# Patient Record
Sex: Female | Born: 1990 | Race: Black or African American | Hispanic: No | Marital: Single | Smoking: Never smoker
Health system: Southern US, Community
[De-identification: ages and names within clinical notes are randomized; demographics above are authoritative.]

## PROBLEM LIST (undated history)

## (undated) ENCOUNTER — Inpatient Hospital Stay (HOSPITAL_COMMUNITY): Payer: Self-pay

## (undated) DIAGNOSIS — Z789 Other specified health status: Secondary | ICD-10-CM

## (undated) HISTORY — PX: NO PAST SURGERIES: SHX2092

---

## 2016-03-17 ENCOUNTER — Emergency Department (HOSPITAL_BASED_OUTPATIENT_CLINIC_OR_DEPARTMENT_OTHER)
Admission: EM | Admit: 2016-03-17 | Discharge: 2016-03-18 | Disposition: A | Discharge status: Home / Self Care | Payer: Self-pay | Attending: Emergency Medicine | Admitting: Emergency Medicine

## 2016-03-17 ENCOUNTER — Encounter (HOSPITAL_BASED_OUTPATIENT_CLINIC_OR_DEPARTMENT_OTHER): Payer: Self-pay | Admitting: *Deleted

## 2016-03-17 DIAGNOSIS — Z3202 Encounter for pregnancy test, result negative: Secondary | ICD-10-CM | POA: Insufficient documentation

## 2016-03-17 DIAGNOSIS — G4489 Other headache syndrome: Secondary | ICD-10-CM | POA: Insufficient documentation

## 2016-03-17 LAB — PREGNANCY, URINE: PREG TEST UR: NEGATIVE

## 2016-03-17 MED ORDER — ACETAMINOPHEN 500 MG PO TABS
1000.0000 mg | ORAL_TABLET | Freq: Once | ORAL | Status: AC
  Administered 2016-03-18: 1000 mg via ORAL
  Filled 2016-03-17: qty 2

## 2016-03-17 MED ORDER — IBUPROFEN 800 MG PO TABS
800.0000 mg | ORAL_TABLET | Freq: Once | ORAL | Status: AC
  Administered 2016-03-18: 800 mg via ORAL
  Filled 2016-03-17: qty 1

## 2016-03-17 NOTE — ED Provider Notes (Signed)
CSN: [id number]     Arrival date & time 03/17/16  2042 History   By signing my name below, I, Forrestine Him, attest that this documentation has been prepared under the direction and in the presence of Dmoni Fortson, MD.  Electronically Signed: Forrestine Him, ED Scribe. 03/17/2016. 11:04 PM.  Chief Complaint  Patient presents with  . Headache   Patient is a 25 y.o. female presenting with headaches. The history is provided by the patient. No language interpreter was used.  Headache Pain location:  Generalized Quality:  Unable to specify Radiates to:  Does not radiate Severity currently:  Unable to specify Severity at highest:  Unable to specify Onset quality:  Gradual Duration:  4 weeks Timing:  Intermittent Progression:  Unchanged Chronicity:  New Context: not intercourse and not straining   Relieved by:  None tried Worsened by:  Nothing Ineffective treatments:  Resting in a darkened room Associated symptoms: no abdominal pain, no congestion, no cough, no dizziness, no fatigue, no fever, no focal weakness, no loss of balance, no myalgias, no nausea, no near-syncope, no neck pain, no neck stiffness, no numbness, no photophobia, no seizures, no swollen glands, no syncope, no URI, no visual change, no vomiting and no weakness   Risk factors: no family hx of SAH     HPI Comments: Mallory Harris is a 25 y.o. female without any pertinent past medical history who presents to the Emergency Department complaining of intermittent, ongoing headache x 3-4 weeks. No alleviating factors. No OTC medications or home remedies attempted prior to arrival. No recent fever, chills, nausea, vomiting, or shortness of breath. Pt denies any history of same.  PCP: No PCP Per Patient    History reviewed. No pertinent past medical history. History reviewed. No pertinent past surgical history. No family history on file. Social History  Substance Use Topics  . Smoking status: Never Smoker   . Smokeless  tobacco: None  . Alcohol Use: No   OB History    No data available     Review of Systems  Constitutional: Negative for fever, chills and fatigue.  HENT: Negative for congestion.   Eyes: Negative for photophobia.  Respiratory: Negative for cough, chest tightness and shortness of breath.   Cardiovascular: Negative for chest pain, palpitations, leg swelling, syncope and near-syncope.  Gastrointestinal: Negative for nausea, vomiting and abdominal pain.  Musculoskeletal: Negative for myalgias, neck pain and neck stiffness.  Neurological: Positive for headaches. Negative for dizziness, tremors, focal weakness, seizures, syncope, facial asymmetry, speech difficulty, weakness, light-headedness, numbness and loss of balance.  Psychiatric/Behavioral: Negative for confusion.  All other systems reviewed and are negative.     Allergies  Review of patient's allergies indicates no known allergies.  Home Medications   Prior to Admission medications   Not on File   Triage Vitals: BP 124/72 mmHg  Pulse 61  Temp(Src) 98.8 F (37.1 C) (Oral)  Resp 18  Ht 5\' 2"  (1.575 m)  Wt 190 lb (86.183 kg)  BMI 34.74 kg/m2  SpO2 100%  LMP 03/15/2016   Physical Exam  Constitutional: She is oriented to person, place, and time. She appears well-developed and well-nourished. No distress.  Sitting comfortably in the room with the lights on.    HENT:  Head: Normocephalic and atraumatic.  Right Ear: External ear normal.  Left Ear: External ear normal.  Mouth/Throat: Oropharynx is clear and moist. No oropharyngeal exudate.  Eyes: Conjunctivae and EOM are normal. Pupils are equal, round, and reactive to light. No scleral  icterus.  Neck: Normal range of motion. Neck supple.  Trachea is midline No lymph nodes noted   Cardiovascular: Normal rate, regular rhythm and normal heart sounds.   Pulmonary/Chest: Effort normal and breath sounds normal. No stridor. She has no wheezes. She has no rales.  Abdominal:  Soft. She exhibits no distension. There is no tenderness. There is no rebound and no guarding.  Hyperactive bowel sounds   Musculoskeletal: Normal range of motion. She exhibits no edema.  Lymphadenopathy:    She has no cervical adenopathy.  Neurological: She is alert and oriented to person, place, and time. She has normal reflexes. She displays normal reflexes. No cranial nerve deficit. Coordination normal.  Cranial nerves 2-12 intact   Skin: Skin is warm and dry. She is not diaphoretic.  Psychiatric: She has a normal mood and affect. Judgment normal.  Nursing note and vitals reviewed.   ED Course  Procedures (including critical care time)  DIAGNOSTIC STUDIES: Oxygen Saturation is 100% on R, Normal by my interpretation.    COORDINATION OF CARE: 11:04 PM- Will order urine pregnancy. Discussed treatment plan with pt at bedside and pt agreed to plan.   Labs Review Labs Reviewed  PREGNANCY, URINE    Imaging Review No results found. I have personally reviewed and evaluated these images and lab results as part of my medical decision-making.   EKG Interpretation None      MDM   Final diagnoses:  None   Filed Vitals:   03/17/16 2049 03/18/16 0055  BP: 124/72 123/78  Pulse: 61 62  Temp: 98.8 F (37.1 C)   Resp: 18 18   Results for orders placed or performed during the hospital encounter of 03/17/16  Pregnancy, urine  Result Value Ref Range   Preg Test, Ur NEGATIVE NEGATIVE   Ct Head Wo Contrast  03/18/2016  CLINICAL DATA:  Right frontal headache for 1 month. EXAM: CT HEAD WITHOUT CONTRAST TECHNIQUE: Contiguous axial images were obtained from the base of the skull through the vertex without intravenous contrast. COMPARISON:  None. FINDINGS: Brain: No evidence of acute infarction, hemorrhage, extra-axial collection, ventriculomegaly, or mass effect. Vascular: No hyperdense vessel or unexpected calcification. Skull: Negative for fracture or focal lesion. Sinuses/Orbits: No  acute findings. Other: None. IMPRESSION: Normal head CT. Electronically Signed   By: Jeb Levering M.D.   On: 03/18/2016 00:41    Medications  acetaminophen (TYLENOL) tablet 1,000 mg (1,000 mg Oral Given 03/18/16 0004)  ibuprofen (ADVIL,MOTRIN) tablet 800 mg (800 mg Oral Given 03/18/16 0004)    Patient has taken no meds at home.  Patient is very well appearing with no signs of ICH or meningitis.  No signs or symptoms of caveronous sinus thrombosis. She is laughing texting and watching TV in the room.  Wanted oral medication and this was provided.  She is stable for discharge with close follow up   I personally performed the services described in this documentation, which was scribed in my presence. The recorded information has been reviewed and is accurate.     Veatrice Kells, MD 03/18/16 [phone]

## 2016-03-17 NOTE — ED Notes (Signed)
Headache for a month. If she leans over she "has to hold the front of her neck to get her self right"  per pt.

## 2016-03-18 ENCOUNTER — Encounter (HOSPITAL_BASED_OUTPATIENT_CLINIC_OR_DEPARTMENT_OTHER): Payer: Self-pay | Admitting: Emergency Medicine

## 2016-03-18 ENCOUNTER — Emergency Department (HOSPITAL_BASED_OUTPATIENT_CLINIC_OR_DEPARTMENT_OTHER): Payer: Self-pay

## 2016-03-18 MED ORDER — IBUPROFEN 800 MG PO TABS: 800.0000 mg | ORAL_TABLET | Freq: Three times a day (TID) | ORAL | Status: DC

## 2016-03-18 NOTE — ED Notes (Signed)
Patient transported to CT 

## 2017-01-04 ENCOUNTER — Encounter (HOSPITAL_BASED_OUTPATIENT_CLINIC_OR_DEPARTMENT_OTHER): Payer: Self-pay

## 2017-01-04 ENCOUNTER — Emergency Department (HOSPITAL_BASED_OUTPATIENT_CLINIC_OR_DEPARTMENT_OTHER)
Admission: EM | Admit: 2017-01-04 | Discharge: 2017-01-05 | Disposition: A | Discharge status: Home / Self Care | Payer: Self-pay | Attending: Emergency Medicine | Admitting: Emergency Medicine

## 2017-01-04 DIAGNOSIS — L03115 Cellulitis of right lower limb: Secondary | ICD-10-CM | POA: Insufficient documentation

## 2017-01-04 DIAGNOSIS — L02415 Cutaneous abscess of right lower limb: Secondary | ICD-10-CM | POA: Insufficient documentation

## 2017-01-04 NOTE — ED Triage Notes (Signed)
Abscess to inner right thigh since Monday, no drainage, no fevers

## 2017-01-05 MED ORDER — SULFAMETHOXAZOLE-TRIMETHOPRIM 800-160 MG PO TABS
1.0000 | ORAL_TABLET | Freq: Once | ORAL | Status: AC
  Administered 2017-01-05: 1 via ORAL
  Filled 2017-01-05: qty 1

## 2017-01-05 MED ORDER — HYDROCODONE-ACETAMINOPHEN 5-325 MG PO TABS: ORAL_TABLET | ORAL | 0 refills | Status: DC

## 2017-01-05 MED ORDER — LIDOCAINE-EPINEPHRINE (PF) 2 %-1:200000 IJ SOLN
20.0000 mL | Freq: Once | INTRAMUSCULAR | Status: AC
  Administered 2017-01-05: 3 mL
  Filled 2017-01-05: qty 20

## 2017-01-05 MED ORDER — SULFAMETHOXAZOLE-TRIMETHOPRIM 800-160 MG PO TABS: 1.0000 | ORAL_TABLET | Freq: Two times a day (BID) | ORAL | 0 refills | Status: AC

## 2017-01-05 NOTE — Discharge Instructions (Signed)
Please read and follow all provided instructions.  Your diagnoses today include:  1. Abscess of right thigh     Tests performed today include:  Vital signs. See below for your results today.   Medications prescribed:   Bactrim (trimethoprim/sulfamethoxazole) - antibiotic  You have been prescribed an antibiotic medicine: take the entire course of medicine even if you are feeling better. Stopping early can cause the antibiotic not to work.   Vicodin (hydrocodone/acetaminophen) - narcotic pain medication  DO NOT drive or perform any activities that require you to be awake and alert because this medicine can make you drowsy. BE VERY CAREFUL not to take multiple medicines containing Tylenol (also called acetaminophen). Doing so can lead to an overdose which can damage your liver and cause liver failure and possibly death.  Take any prescribed medications only as directed.   Home care instructions:   Follow any educational materials contained in this packet  Follow-up instructions: Return to the Emergency Department in 48 hours for a recheck if your symptoms are not significantly improved.  Please follow-up with your primary care provider in the next 1 week for further evaluation of your symptoms.   Return instructions:  Return to the Emergency Department if you have:  Fever  Worsening symptoms  Worsening pain  Worsening swelling  Redness of the skin that moves away from the affected area, especially if it streaks away from the affected area   Any other emergent concerns  Your vital signs today were: BP 121/75 (BP Location: Left Arm)    Pulse 90    Temp 98 F (36.7 C) (Oral)    Resp 18    Ht 5\' 2"  (1.575 m)    Wt 81.6 kg    LMP 12/11/2016    SpO2 100%    BMI 32.92 kg/m  If your blood pressure (BP) was elevated above 135/85 this visit, please have this repeated by your doctor within one month. --------------

## 2017-01-05 NOTE — ED Provider Notes (Signed)
[place] Provider Note   CSN: [id number] Arrival date & time: 01/04/17  2144     History   Chief Complaint Chief Complaint  Patient presents with  . Abscess    HPI Mallory Harris is a 26 y.o. female.  Patient presents with three-day history of redness, pain, and swelling to the right thigh. Patient has a history of abscesses that have spontaneously resolved. No drainage. No fevers, vomiting. No treatments prior to arrival. Onset of symptoms gradual. Course is worsening. Nothing makes symptoms better or worse.      History reviewed. No pertinent past medical history.  There are no active problems to display for this patient.   History reviewed. No pertinent surgical history.  OB History    No data available       Home Medications    Prior to Admission medications   Medication Sig Start Date End Date Taking? Authorizing Provider  HYDROcodone-acetaminophen (NORCO/VICODIN) 5-325 MG tablet Take 1-2 tablets every 6 hours as needed for severe pain 01/05/17   Carlisle Cater, PA-C  sulfamethoxazole-trimethoprim (BACTRIM DS,SEPTRA DS) 800-160 MG tablet Take 1 tablet by mouth 2 (two) times daily. 01/05/17 01/12/17  Carlisle Cater, PA-C    Family History No family history on file.  Social History Social History  Substance Use Topics  . Smoking status: Never Smoker  . Smokeless tobacco: Not on file  . Alcohol use No     Allergies   Patient has no known allergies.   Review of Systems Review of Systems  Constitutional: Negative for fever.  Gastrointestinal: Negative for nausea and vomiting.  Skin: Negative for color change.       Positive for abscess.  Hematological: Negative for adenopathy.     Physical Exam Updated Vital Signs BP 121/75 (BP Location: Left Arm)   Pulse 90   Temp 98 F (36.7 C) (Oral)   Resp 18   Ht 5\' 2"  (1.575 m)   Wt 81.6 kg   LMP 12/11/2016   SpO2 100%   BMI 32.92 kg/m   Physical Exam  Constitutional: She appears  well-developed and well-nourished.  HENT:  Head: Normocephalic and atraumatic.  Eyes: Conjunctivae are normal. Right eye exhibits no discharge. Left eye exhibits no discharge.  Neck: Normal range of motion. Neck supple.  Cardiovascular: Normal rate, regular rhythm and normal heart sounds.   Pulmonary/Chest: Effort normal and breath sounds normal.  Abdominal: Soft. There is no tenderness.  Neurological: She is alert.  Skin: Skin is warm and dry.  Patient with area of abscess and cellulitis on inner right thigh. There is approximately 5 cm induration and an additional 5 cm of surrounding cellulitis.  Psychiatric: She has a normal mood and affect.  Nursing note and vitals reviewed.    ED Treatments / Results   Procedures Procedures (including critical care time)  Medications Ordered in ED Medications  lidocaine-EPINEPHrine (XYLOCAINE W/EPI) 2 %-1:200000 (PF) injection 20 mL (3 mLs Infiltration Given 01/05/17 0054)  sulfamethoxazole-trimethoprim (BACTRIM DS,SEPTRA DS) 800-160 MG per tablet 1 tablet (1 tablet Oral Given 01/05/17 0054)     Initial Impression / Assessment and Plan / ED Course  I have reviewed the triage vital signs and the nursing notes.  Pertinent labs & imaging results that were available during my care of the patient were reviewed by me and considered in my medical decision making (see chart for details).      INCISION AND DRAINAGE Performed by: Cephus Richer PA-S2 Consent: Verbal consent obtained. Risks and  benefits: risks, benefits and alternatives were discussed Type: abscess  Body area: R thigh  Anesthesia: local infiltration  Incision was made with a scalpel.  Local anesthetic: lidocaine 2% with epinephrine  Anesthetic total: 4 ml  Complexity: complex Blunt dissection to break up loculations  Drainage: purulent  Drainage amount: large  Packing material: none  Patient tolerance: Patient tolerated the procedure well with no immediate  complications.   1:02 AM The patient was urged to return to the Emergency Department urgently with worsening pain, swelling, expanding erythema especially if it streaks away from the affected area, fever, or if they have any other concerns.   The patient was urged to return to the Emergency Department or go to their PCP in 48 hours for wound recheck if the area is not significantly improved.  The patient verbalized understanding and stated agreement with this plan.   Patient counseled on use of narcotic pain medications. Counseled not to combine these medications with others containing tylenol. Urged not to drink alcohol, drive, or perform any other activities that requires focus while taking these medications. The patient verbalizes understanding and agrees with the plan.   Final Clinical Impressions(s) / ED Diagnoses   Final diagnoses:  Abscess of right thigh   Patient with skin abscess amenable to incision and drainage. Antibiotic given due to size and associated cellulitis. Patient appears well, nontoxic. No indications for admission or further workup.  New Prescriptions New Prescriptions   HYDROCODONE-ACETAMINOPHEN (NORCO/VICODIN) 5-325 MG TABLET    Take 1-2 tablets every 6 hours as needed for severe pain   SULFAMETHOXAZOLE-TRIMETHOPRIM (BACTRIM DS,SEPTRA DS) 800-160 MG TABLET    Take 1 tablet by mouth 2 (two) times daily.     Carlisle Cater, PA-C 01/05/17 0104    April Palumbo, MD 01/05/17 [phone]

## 2018-01-16 ENCOUNTER — Inpatient Hospital Stay (HOSPITAL_COMMUNITY): Payer: Medicaid Other

## 2018-01-16 ENCOUNTER — Inpatient Hospital Stay (HOSPITAL_COMMUNITY)
Admission: AD | Admit: 2018-01-16 | Discharge: 2018-01-17 | Disposition: A | Discharge status: Home / Self Care | Payer: Medicaid Other | Source: Ambulatory Visit | Attending: Family Medicine | Admitting: Family Medicine

## 2018-01-16 ENCOUNTER — Encounter (HOSPITAL_COMMUNITY): Payer: Self-pay

## 2018-01-16 DIAGNOSIS — Z3A01 Less than 8 weeks gestation of pregnancy: Secondary | ICD-10-CM | POA: Diagnosis not present

## 2018-01-16 DIAGNOSIS — B9689 Other specified bacterial agents as the cause of diseases classified elsewhere: Secondary | ICD-10-CM

## 2018-01-16 DIAGNOSIS — O26899 Other specified pregnancy related conditions, unspecified trimester: Secondary | ICD-10-CM

## 2018-01-16 DIAGNOSIS — N760 Acute vaginitis: Secondary | ICD-10-CM

## 2018-01-16 DIAGNOSIS — R1030 Lower abdominal pain, unspecified: Secondary | ICD-10-CM | POA: Insufficient documentation

## 2018-01-16 DIAGNOSIS — R102 Pelvic and perineal pain: Secondary | ICD-10-CM

## 2018-01-16 DIAGNOSIS — O23591 Infection of other part of genital tract in pregnancy, first trimester: Secondary | ICD-10-CM | POA: Diagnosis not present

## 2018-01-16 DIAGNOSIS — R109 Unspecified abdominal pain: Secondary | ICD-10-CM | POA: Diagnosis present

## 2018-01-16 DIAGNOSIS — O26891 Other specified pregnancy related conditions, first trimester: Secondary | ICD-10-CM | POA: Insufficient documentation

## 2018-01-16 LAB — CBC
HCT: 34.1 % — ABNORMAL LOW (ref 36.0–46.0)
HEMOGLOBIN: 11.2 g/dL — AB (ref 12.0–15.0)
MCH: 27.7 pg (ref 26.0–34.0)
MCHC: 32.8 g/dL (ref 30.0–36.0)
MCV: 84.2 fL (ref 78.0–100.0)
Platelets: 211 10*3/uL (ref 150–400)
RBC: 4.05 MIL/uL (ref 3.87–5.11)
RDW: 12.6 % (ref 11.5–15.5)
WBC: 5.3 10*3/uL (ref 4.0–10.5)

## 2018-01-16 LAB — WET PREP, GENITAL
Sperm: NONE SEEN
Trich, Wet Prep: NONE SEEN
YEAST WET PREP: NONE SEEN

## 2018-01-16 LAB — URINALYSIS, ROUTINE W REFLEX MICROSCOPIC
Bilirubin Urine: NEGATIVE
Glucose, UA: NEGATIVE mg/dL
Hgb urine dipstick: NEGATIVE
Ketones, ur: NEGATIVE mg/dL
LEUKOCYTES UA: NEGATIVE
Nitrite: NEGATIVE
PROTEIN: NEGATIVE mg/dL
Specific Gravity, Urine: 1.011 (ref 1.005–1.030)
pH: 6 (ref 5.0–8.0)

## 2018-01-16 LAB — HCG, QUANTITATIVE, PREGNANCY: HCG, BETA CHAIN, QUANT, S: 3248 m[IU]/mL — AB (ref <5)

## 2018-01-16 LAB — POCT PREGNANCY, URINE: Preg Test, Ur: POSITIVE — AB

## 2018-01-16 NOTE — MAU Provider Note (Signed)
Chief Complaint: Abdominal Pain and Back Pain   First Provider Initiated Contact with Patient 01/16/18 2255        SUBJECTIVE HPI: Mallory Harris is a 27 y.o. No obstetric history on file. at Unknown by LMP who presents to maternity admissions reporting lower abdominal and back pain.  Has been there for about a week.  Not one sided. . She denies vaginal bleeding, vaginal itching/burning, urinary symptoms, h/a, dizziness, n/v, or fever/chills.     Abdominal Pain  This is a new problem. The current episode started in the past 7 days. The onset quality is gradual. The problem has been unchanged. The pain is mild. The quality of the pain is cramping and aching. The abdominal pain radiates to the back. Pertinent negatives include no constipation, diarrhea, dysuria, fever, myalgias, nausea or vomiting. Nothing aggravates the pain. The pain is relieved by nothing. She has tried nothing for the symptoms.  Back Pain  This is a new problem. The current episode started in the past 7 days. The problem is unchanged. The pain is present in the lumbar spine. The quality of the pain is described as aching. Associated symptoms include abdominal pain. Pertinent negatives include no dysuria or fever.    RN Note: Pt here with c/o abdominal and back pains. Not sure if pregnant. Not on birth control currently.    No past medical history on file. No past surgical history on file. Social History   Socioeconomic History  . Marital status: Single    Spouse name: Not on file  . Number of children: Not on file  . Years of education: Not on file  . Highest education level: Not on file  Social Needs  . Financial resource strain: Not on file  . Food insecurity - worry: Not on file  . Food insecurity - inability: Not on file  . Transportation needs - medical: Not on file  . Transportation needs - non-medical: Not on file  Occupational History  . Not on file  Tobacco Use  . Smoking status: Never Smoker   Substance and Sexual Activity  . Alcohol use: No  . Drug use: No  . Sexual activity: Yes    Birth control/protection: None  Other Topics Concern  . Not on file  Social History Narrative  . Not on file   No current facility-administered medications on file prior to encounter.    Current Outpatient Medications on File Prior to Encounter  Medication Sig Dispense Refill  . HYDROcodone-acetaminophen (NORCO/VICODIN) 5-325 MG tablet Take 1-2 tablets every 6 hours as needed for severe pain 6 tablet 0   No Known Allergies  I have reviewed patient's Past Medical Hx, Surgical Hx, Family Hx, Social Hx, medications and allergies.   ROS:  Review of Systems  Constitutional: Negative for fever.  Gastrointestinal: Positive for abdominal pain. Negative for constipation, diarrhea, nausea and vomiting.  Genitourinary: Negative for dysuria.  Musculoskeletal: Positive for back pain. Negative for myalgias.   Review of Systems  Other systems negative   Physical Exam  Physical Exam Patient Vitals for the past 24 hrs:  BP Temp Temp src Resp SpO2 Height Weight  01/16/18 2151 133/61 98.3 F (36.8 C) Oral 18 100 % 5\' 2"  (1.575 m) 187 lb (84.8 kg)   Constitutional: Well-developed, well-nourished female in no acute distress.  Cardiovascular: normal rate Respiratory: normal effort GI: Abd soft, non-tender. Pos BS x 4 MS: Extremities nontender, no edema, normal ROM Neurologic: Alert and oriented x 4.  GU: Neg  CVAT.  PELVIC EXAM: Cervix pink, visually closed, without lesion, scant white creamy discharge, vaginal walls and external genitalia normal Bimanual exam: Cervix 0/long/high, firm, anterior, neg CMT, uterus nontender, nonenlarged, adnexa without tenderness, enlargement, or mass   LAB RESULTS Results for orders placed or performed during the hospital encounter of 01/16/18 (from the past 24 hour(s))  Urinalysis, Routine w reflex microscopic     Status: None   Collection Time: 01/16/18  9:53  PM  Result Value Ref Range   Color, Urine YELLOW YELLOW   APPearance CLEAR CLEAR   Specific Gravity, Urine 1.011 1.005 - 1.030   pH 6.0 5.0 - 8.0   Glucose, UA NEGATIVE NEGATIVE mg/dL   Hgb urine dipstick NEGATIVE NEGATIVE   Bilirubin Urine NEGATIVE NEGATIVE   Ketones, ur NEGATIVE NEGATIVE mg/dL   Protein, ur NEGATIVE NEGATIVE mg/dL   Nitrite NEGATIVE NEGATIVE   Leukocytes, UA NEGATIVE NEGATIVE  Pregnancy, urine POC     Status: Abnormal   Collection Time: 01/16/18  9:59 PM  Result Value Ref Range   Preg Test, Ur POSITIVE (A) NEGATIVE  CBC     Status: Abnormal   Collection Time: 01/16/18 10:29 PM  Result Value Ref Range   WBC 5.3 4.0 - 10.5 K/uL   RBC 4.05 3.87 - 5.11 MIL/uL   Hemoglobin 11.2 (L) 12.0 - 15.0 g/dL   HCT 34.1 (L) 36.0 - 46.0 %   MCV 84.2 78.0 - 100.0 fL   MCH 27.7 26.0 - 34.0 pg   MCHC 32.8 30.0 - 36.0 g/dL   RDW 12.6 11.5 - 15.5 %   Platelets 211 150 - 400 K/uL  hCG, quantitative, pregnancy     Status: Abnormal   Collection Time: 01/16/18 10:29 PM  Result Value Ref Range   hCG, Beta Chain, Quant, S 3,248 (H) <5 mIU/mL  Wet prep, genital     Status: Abnormal   Collection Time: 01/16/18 11:00 PM  Result Value Ref Range   Yeast Wet Prep HPF POC NONE SEEN NONE SEEN   Trich, Wet Prep NONE SEEN NONE SEEN   Clue Cells Wet Prep HPF POC PRESENT (A) NONE SEEN   WBC, Wet Prep HPF POC FEW (A) NONE SEEN   Sperm NONE SEEN        IMAGING US Ob Comp Less 14 Wks  Result Date: 01/17/2018 CLINICAL DATA:  Lower abdominal pain and cramping for 1 week. Beta HCG 3248. Gestational age by last menstrual period 4 weeks and 5 days. EXAM: OBSTETRIC <14 WK [place] AND TRANSVAGINAL OB US TECHNIQUE: Both transabdominal and transvaginal ultrasound examinations were performed for complete evaluation of the gestation as well as the maternal uterus, adnexal regions, and pelvic cul-de-sac. Transvaginal technique was performed to assess early pregnancy. COMPARISON:  None. FINDINGS: Intrauterine  gestational sac: Present Yolk sac:  Present Embryo:  Not present Cardiac Activity: Not present MSD: 5 mm   5 w   1 d Subchorionic hemorrhage:  None visualized. Maternal uterus/adnexae: Normal appearance of the adnexa. RIGHT intra ovarian 15 mm corpus luteal cyst. No free fluid. IMPRESSION: Early intrauterine gestational and yolk sac, without fetal pole, or cardiac activity yet visualized. Recommend follow-up quantitative B-HCG levels and follow-up US in 14 days to confirm and assess viability. This recommendation follows SRU consensus guidelines: Diagnostic Criteria for Nonviable Pregnancy Early in the First Trimester. [employer] Med 2013; 161:0960-45. Electronically Signed   By: Elon Alas M.D.   On: 01/17/2018 00:04   US Ob Transvaginal  Result Date:  01/17/2018 CLINICAL DATA:  Lower abdominal pain and cramping for 1 week. Beta HCG 3248. Gestational age by last menstrual period 4 weeks and 5 days. EXAM: OBSTETRIC <14 WK [place] AND TRANSVAGINAL OB US TECHNIQUE: Both transabdominal and transvaginal ultrasound examinations were performed for complete evaluation of the gestation as well as the maternal uterus, adnexal regions, and pelvic cul-de-sac. Transvaginal technique was performed to assess early pregnancy. COMPARISON:  None. FINDINGS: Intrauterine gestational sac: Present Yolk sac:  Present Embryo:  Not present Cardiac Activity: Not present MSD: 5 mm   5 w   1 d Subchorionic hemorrhage:  None visualized. Maternal uterus/adnexae: Normal appearance of the adnexa. RIGHT intra ovarian 15 mm corpus luteal cyst. No free fluid. IMPRESSION: Early intrauterine gestational and yolk sac, without fetal pole, or cardiac activity yet visualized. Recommend follow-up quantitative B-HCG levels and follow-up US in 14 days to confirm and assess viability. This recommendation follows SRU consensus guidelines: Diagnostic Criteria for Nonviable Pregnancy Early in the First Trimester. [employer] Med 2013; 115:7262-03. Electronically  Signed   By: Elon Alas M.D.   On: 01/17/2018 00:04    MAU Management/MDM: Ordered usual first trimester r/o ectopic labs.   Pelvic exam and cultures done Will check baseline Ultrasound to rule out ectopic.  This bleeding/pain can represent a normal pregnancy with bleeding, spontaneous abortion or even an ectopic which can be life-threatening.  The process as listed above helps to determine which of these is present.  Reviewed results.  Yolk sac effectively rules out ectopic pregnancy.   ASSESSMENT Single IUP at [redacted]w[redacted]d Lower abdominal pain Bacterial Vaginosis  PLAN Discharge home Rx Flagyl for BV Proof of pregnancy letter given Encouraged to start prenatal care  Pt stable at time of discharge. Encouraged to return here or to other Urgent Care/ED if she develops worsening of symptoms, increase in pain, fever, or other concerning symptoms.    Hansel Feinstein CNM, MSN Certified Nurse-Midwife 01/16/2018  10:56 PM

## 2018-01-16 NOTE — MAU Note (Signed)
Pt here with c/o abdominal and back pains. Not sure if pregnant. Not on birth control currently.

## 2018-01-17 ENCOUNTER — Encounter (HOSPITAL_COMMUNITY): Payer: Self-pay

## 2018-01-17 DIAGNOSIS — B9689 Other specified bacterial agents as the cause of diseases classified elsewhere: Secondary | ICD-10-CM

## 2018-01-17 DIAGNOSIS — N760 Acute vaginitis: Secondary | ICD-10-CM

## 2018-01-17 LAB — HIV ANTIBODY (ROUTINE TESTING W REFLEX): HIV Screen 4th Generation wRfx: NONREACTIVE

## 2018-01-17 LAB — GC/CHLAMYDIA PROBE AMP (~~LOC~~) NOT AT ARMC
CHLAMYDIA, DNA PROBE: NEGATIVE
Neisseria Gonorrhea: NEGATIVE

## 2018-01-17 MED ORDER — PRENATAL VITAMINS 0.8 MG PO TABS: 1.0000 | ORAL_TABLET | Freq: Every day | ORAL | 12 refills | Status: DC

## 2018-01-17 MED ORDER — METRONIDAZOLE 500 MG PO TABS: 500.0000 mg | ORAL_TABLET | Freq: Two times a day (BID) | ORAL | 0 refills | Status: AC

## 2018-01-17 NOTE — Discharge Instructions (Signed)
Prenatal Care Providers  OB/GYN  & Infertility  [phone]     Phone: [place]                      [facility]  @[place]     Phone: [phone]  Phone: [place]     Phone: [phone]  Phone: [phone]           [facility] [place]                hone: [phone]  Phone: [phone]         [facility]                                Phone: [place] [facility].                Phone: [phone]  [place])          Phone: [phone] [facility]   Phone: [phone]  Bacterial Vaginosis Bacterial vaginosis is a vaginal infection that occurs when the normal balance of bacteria in the vagina is disrupted. It results from an overgrowth of certain bacteria. This is the most common vaginal infection among women ages 71-44. Because bacterial vaginosis increases your risk for STIs (sexually transmitted infections), getting treated can help reduce your risk for chlamydia, gonorrhea, herpes, and HIV (human immunodeficiency virus). Treatment is also important for preventing complications in pregnant women, because this condition can cause an early (premature) delivery. What are the causes? This condition is caused by an increase in harmful bacteria that are normally present in small amounts in the vagina. However, the reason that the condition develops is not fully understood. What increases the risk? The following factors may make you more likely to develop this condition:  Having a new sexual partner or multiple sexual partners.  Having unprotected sex.  Douching.  Having an intrauterine device (IUD).  Smoking.  Drug and alcohol abuse.  Taking certain  antibiotic medicines.  Being pregnant.  You cannot get bacterial vaginosis from toilet seats, bedding, swimming pools, or contact with objects around you. What are the signs or symptoms? Symptoms of this condition include:  Grey or white vaginal discharge. The discharge can also be watery or foamy.  A fish-like odor with discharge, especially after sexual intercourse or during menstruation.  Itching in and around the vagina.  Burning or pain with urination.  Some women with bacterial vaginosis have no signs or symptoms. How is this diagnosed? This condition is diagnosed based on:  Your medical history.  A physical exam of the vagina.  Testing a sample of vaginal fluid under a microscope to look for a large amount of bad bacteria or abnormal cells. Your health care provider may use a cotton swab or a small wooden spatula to collect the sample.  How is this treated? This condition is treated with antibiotics. These may be given as a pill, a vaginal cream, or a medicine that is put into the vagina (suppository). If the condition comes  back after treatment, a second round of antibiotics may be needed. Follow these instructions at home: Medicines  Take over-the-counter and prescription medicines only as told by your health care provider.  Take or use your antibiotic as told by your health care provider. Do not stop taking or using the antibiotic even if you start to feel better. General instructions  If you have a female sexual partner, tell her that you have a vaginal infection. She should see her health care provider and be treated if she has symptoms. If you have a female sexual partner, he does not need treatment.  During treatment: ? Avoid sexual activity until you finish treatment. ? Do not douche. ? Avoid alcohol as directed by your health care provider. ? Avoid breastfeeding as directed by your health care provider.  Drink enough water and fluids to keep your urine clear  or pale yellow.  Keep the area around your vagina and rectum clean. ? Wash the area daily with warm water. ? Wipe yourself from front to back after using the toilet.  Keep all follow-up visits as told by your health care provider. This is important. How is this prevented?  Do not douche.  Wash the outside of your vagina with warm water only.  Use protection when having sex. This includes latex condoms and dental dams.  Limit how many sexual partners you have. To help prevent bacterial vaginosis, it is best to have sex with just one partner (monogamous).  Make sure you and your sexual partner are tested for STIs.  Wear cotton or cotton-lined underwear.  Avoid wearing tight pants and pantyhose, especially during summer.  Limit the amount of alcohol that you drink.  Do not use any products that contain nicotine or tobacco, such as cigarettes and e-cigarettes. If you need help quitting, ask your health care provider.  Do not use illegal drugs. Where to find more information:  Centers for Disease Control and Prevention: AppraiserFraud.fi  American Sexual Health Association (ASHA): www.ashastd.org  U.S. Department of Health and Financial controller, Office on Women's Health: DustingSprays.pl or SecuritiesCard.it Contact a health care provider if:  Your symptoms do not improve, even after treatment.  You have more discharge or pain when urinating.  You have a fever.  You have pain in your abdomen.  You have pain during sex.  You have vaginal bleeding between periods. Summary  Bacterial vaginosis is a vaginal infection that occurs when the normal balance of bacteria in the vagina is disrupted.  Because bacterial vaginosis increases your risk for STIs (sexually transmitted infections), getting treated can help reduce your risk for chlamydia, gonorrhea, herpes, and HIV (human immunodeficiency virus). Treatment is also important for  preventing complications in pregnant women, because the condition can cause an early (premature) delivery.  This condition is treated with antibiotic medicines. These may be given as a pill, a vaginal cream, or a medicine that is put into the vagina (suppository). This information is not intended to replace advice given to you by your health care provider. Make sure you discuss any questions you have with your health care provider. Document Released: 11/14/2005 Document Revised: 03/20/2017 Document Reviewed: 07/30/2016 Elsevier Interactive Patient Education  2018 [place] of Pregnancy The first trimester of pregnancy is from week 1 until the end of week 13 (months 1 through 3). During this time, your baby will begin to develop inside you. At 6-8 weeks, the eyes and face are formed, and the heartbeat can be seen on ultrasound.  At the end of 12 weeks, all the baby's organs are formed. Prenatal care is all the medical care you receive before the birth of your baby. Make sure you get good prenatal care and follow all of your doctor's instructions. Follow these instructions at home: Medicines  Take over-the-counter and prescription medicines only as told by your doctor. Some medicines are safe and some medicines are not safe during pregnancy.  Take a prenatal vitamin that contains at least 600 micrograms (mcg) of folic acid.  If you have trouble pooping (constipation), take medicine that will make your stool soft (stool softener) if your doctor approves. Eating and drinking  Eat regular, healthy meals.  Your doctor will tell you the amount of weight gain that is right for you.  Avoid raw meat and uncooked cheese.  If you feel sick to your stomach (nauseous) or throw up (vomit): ? Eat 4 or 5 small meals a day instead of 3 large meals. ? Try eating a few soda crackers. ? Drink liquids between meals instead of during meals.  To prevent constipation: ? Eat foods that are  high in fiber, like fresh fruits and vegetables, whole grains, and beans. ? Drink enough fluids to keep your pee (urine) clear or pale yellow. Activity  Exercise only as told by your doctor. Stop exercising if you have cramps or pain in your lower belly (abdomen) or low back.  Do not exercise if it is too hot, too humid, or if you are in a place of great height (high altitude).  Try to avoid standing for long periods of time. Move your legs often if you must stand in one place for a long time.  Avoid heavy lifting.  Wear low-heeled shoes. Sit and stand up straight.  You can have sex unless your doctor tells you not to. Relieving pain and discomfort  Wear a good support bra if your breasts are sore.  Take warm water baths (sitz baths) to soothe pain or discomfort caused by hemorrhoids. Use hemorrhoid cream if your doctor says it is okay.  Rest with your legs raised if you have leg cramps or low back pain.  If you have puffy, bulging veins (varicose veins) in your legs: ? Wear support hose or compression stockings as told by your doctor. ? Raise (elevate) your feet for 15 minutes, 3-4 times a day. ? Limit salt in your food. Prenatal care  Schedule your prenatal visits by the twelfth week of pregnancy.  Write down your questions. Take them to your prenatal visits.  Keep all your prenatal visits as told by your doctor. This is important. Safety  Wear your seat belt at all times when driving.  Make a list of emergency phone numbers. The list should include numbers for family, friends, the hospital, and police and fire departments. General instructions  Ask your doctor for a referral to a local prenatal class. Begin classes no later than at the start of month 6 of your pregnancy.  Ask for help if you need counseling or if you need help with nutrition. Your doctor can give you advice or tell you where to go for help.  Do not use hot tubs, steam rooms, or saunas.  Do not douche  or use tampons or scented sanitary pads.  Do not cross your legs for long periods of time.  Avoid all herbs and alcohol. Avoid drugs that are not approved by your doctor.  Do not use any tobacco products, including cigarettes, chewing tobacco, and  electronic cigarettes. If you need help quitting, ask your doctor. You may get counseling or other support to help you quit.  Avoid cat litter boxes and soil used by cats. These carry germs that can cause birth defects in the baby and can cause a loss of your baby (miscarriage) or stillbirth.  Visit your dentist. At home, brush your teeth with a soft toothbrush. Be gentle when you floss. Contact a doctor if:  You are dizzy.  You have mild cramps or pressure in your lower belly.  You have a nagging pain in your belly area.  You continue to feel sick to your stomach, you throw up, or you have watery poop (diarrhea).  You have a bad smelling fluid coming from your vagina.  You have pain when you pee (urinate).  You have increased puffiness (swelling) in your face, hands, legs, or ankles. Get help right away if:  You have a fever.  You are leaking fluid from your vagina.  You have spotting or bleeding from your vagina.  You have very bad belly cramping or pain.  You gain or lose weight rapidly.  You throw up blood. It may look like coffee grounds.  You are around people who have [place] measles, fifth disease, or chickenpox.  You have a very bad headache.  You have shortness of breath.  You have any kind of trauma, such as from a fall or a car accident. Summary  The first trimester of pregnancy is from week 1 until the end of week 13 (months 1 through 3).  To take care of yourself and your unborn baby, you will need to eat healthy meals, take medicines only if your doctor tells you to do so, and do activities that are safe for you and your baby.  Keep all follow-up visits as told by your doctor. This is important as your  doctor will have to ensure that your baby is healthy and growing well. This information is not intended to replace advice given to you by your health care provider. Make sure you discuss any questions you have with your health care provider. Document Released: 05/02/2008 Document Revised: 11/22/2016 Document Reviewed: 11/22/2016 Elsevier Interactive Patient Education  2017 [employer].

## 2018-02-26 ENCOUNTER — Inpatient Hospital Stay (HOSPITAL_COMMUNITY)
Admission: AD | Admit: 2018-02-26 | Discharge: 2018-02-26 | Disposition: A | Discharge status: Home / Self Care | Payer: Medicaid Other | Source: Ambulatory Visit | Attending: Obstetrics & Gynecology | Admitting: Obstetrics & Gynecology

## 2018-02-26 ENCOUNTER — Other Ambulatory Visit: Payer: Self-pay

## 2018-02-26 DIAGNOSIS — O26891 Other specified pregnancy related conditions, first trimester: Secondary | ICD-10-CM | POA: Insufficient documentation

## 2018-02-26 DIAGNOSIS — Z711 Person with feared health complaint in whom no diagnosis is made: Secondary | ICD-10-CM

## 2018-02-26 DIAGNOSIS — O0931 Supervision of pregnancy with insufficient antenatal care, first trimester: Secondary | ICD-10-CM | POA: Insufficient documentation

## 2018-02-26 DIAGNOSIS — Z3491 Encounter for supervision of normal pregnancy, unspecified, first trimester: Secondary | ICD-10-CM

## 2018-02-26 DIAGNOSIS — Z3A11 11 weeks gestation of pregnancy: Secondary | ICD-10-CM | POA: Insufficient documentation

## 2018-02-26 NOTE — [facility] Provider Note (Signed)
S: Mallory Harris is a 27 y.o. G1P0 at [redacted]w[redacted]d who presents for a "check up". She has not been able to start prenatal care yet b/c her insurance is pending. Denies abdominal pain or vaginal bleeding.   O: BP 126/68 (BP Location: Right Arm)   Pulse 75   Temp 99.3 F (37.4 C) (Oral)   Resp 17   Wt 193 lb 8 oz (87.8 kg)   LMP 12/14/2017 (Exact Date)   SpO2 100%   BMI 35.39 kg/m   Constitutional: Well-developed, well-nourished female in no acute distress Respiratory: normal effort GI: Abdomen soft, non tender Neuro: Alert & oriented x 4  FHT 164 by doppler  A:  1. Fetal heart tones present, first trimester   2. [redacted] weeks gestation of pregnancy   3. Physically well but worried    P: Discharge home List of OB/gyns given -- start prenatal care Continue taking PNV Discussed reasons to return to [facility]U  Jorje Guild, NP

## 2018-02-26 NOTE — MAU Note (Signed)
Just here for a check ups.  Was here at 5 wks, is waiting on medicaid. Started spotting last Tues or Wed, stopped.  Has had some abd pain, none now.

## 2018-02-26 NOTE — Discharge Instructions (Signed)
[place] for [place] at [facility]       Phone: [phone]  [facility] [place] at [place] Phone: [place] for [place] at [place]  Phone: [place] for [place] at [facility]  Phone: [place] [facility] [place] at [place]  Phone: [facility]       Phone: [phone]  [facility]    Phone: [phone]   [facility])    Phone: [place] [facility]    Phone: [phone]  [facility]    Phone: [place] [facility]  Phone: [place]    Phone: [phone]  [facility] [place]   Phone: [phone]  [place] [facility]    Phone: [phone]   Safe Medications in Pregnancy   Acne: Benzoyl Peroxide Salicylic Acid  Backache/Headache: Tylenol: 2 regular strength every 4 hours OR              2 Extra strength every 6 hours  Colds/Coughs/Allergies: Benadryl (alcohol free) 25 mg every 6 hours as needed Breath right strips Claritin Cepacol throat lozenges Chloraseptic throat spray Cold-Eeze- up to three times per day Cough drops, alcohol free Flonase (by prescription only) Guaifenesin Mucinex Robitussin DM (plain only, alcohol free) Saline nasal spray/drops Sudafed (pseudoephedrine) & Actifed ** use only after [redacted] weeks gestation and if you do not have high blood pressure Tylenol Vicks Vaporub Zinc lozenges Zyrtec   Constipation: Colace Ducolax suppositories Fleet enema Glycerin suppositories Metamucil Milk of magnesia Miralax Senokot Smooth move tea  Diarrhea: Kaopectate Imodium A-D  *NO pepto Bismol  Hemorrhoids: Anusol Anusol HC Preparation  H Tucks  Indigestion: Tums Maalox Mylanta Zantac  Pepcid  Insomnia: Benadryl (alcohol free) 25mg  every 6 hours as needed Tylenol PM Unisom, no Gelcaps  Leg Cramps: Tums MagGel  Nausea/Vomiting:  Bonine Dramamine Emetrol Ginger extract Sea bands Meclizine  Nausea medication to take during pregnancy:  Unisom (doxylamine succinate 25 mg tablets) Take one tablet daily at bedtime. If symptoms are not adequately controlled, the dose can be increased to a maximum recommended dose of two tablets daily (1/2 tablet in the morning, 1/2 tablet mid-afternoon and one at bedtime). Vitamin B6 100mg  tablets. Take one tablet twice a day (up to 200 mg per day).  Skin Rashes: Aveeno products Benadryl cream or 25mg  every 6 hours as needed Calamine Lotion 1% cortisone cream  Yeast infection: Gyne-lotrimin 7 Monistat 7  Gum/tooth pain: Anbesol  **If taking multiple medications, please check labels to avoid duplicating the same active ingredients **take medication as directed on the label ** Do not exceed 4000 mg of tylenol in 24 hours **Do not take medications that contain aspirin or ibuprofen     First Trimester of Pregnancy The first trimester of pregnancy is from week 1 until the end of week 13 (months 1 through 3). During this time, your baby will begin to develop inside you. At 6-8 weeks, the eyes and face are formed, and the heartbeat can be seen on ultrasound. At the end of 12 weeks, all the baby's organs are formed. Prenatal care is all the medical care you receive before the birth of your baby. Make sure you get good prenatal care and follow all of your doctor's instructions. Follow these instructions at home: Medicines  Take over-the-counter and prescription medicines only as told  by your doctor. Some medicines are safe and some medicines are not safe during pregnancy.  Take a prenatal vitamin that contains at least 600 micrograms (mcg) of folic acid.  If you have  trouble pooping (constipation), take medicine that will make your stool soft (stool softener) if your doctor approves. Eating and drinking  Eat regular, healthy meals.  Your doctor will tell you the amount of weight gain that is right for you.  Avoid raw meat and uncooked cheese.  If you feel sick to your stomach (nauseous) or throw up (vomit): ? Eat 4 or 5 small meals a day instead of 3 large meals. ? Try eating a few soda crackers. ? Drink liquids between meals instead of during meals.  To prevent constipation: ? Eat foods that are high in fiber, like fresh fruits and vegetables, whole grains, and beans. ? Drink enough fluids to keep your pee (urine) clear or pale yellow. Activity  Exercise only as told by your doctor. Stop exercising if you have cramps or pain in your lower belly (abdomen) or low back.  Do not exercise if it is too hot, too humid, or if you are in a place of great height (high altitude).  Try to avoid standing for long periods of time. Move your legs often if you must stand in one place for a long time.  Avoid heavy lifting.  Wear low-heeled shoes. Sit and stand up straight.  You can have sex unless your doctor tells you not to. Relieving pain and discomfort  Wear a good support bra if your breasts are sore.  Take warm water baths (sitz baths) to soothe pain or discomfort caused by hemorrhoids. Use hemorrhoid cream if your doctor says it is okay.  Rest with your legs raised if you have leg cramps or low back pain.  If you have puffy, bulging veins (varicose veins) in your legs: ? Wear support hose or compression stockings as told by your doctor. ? Raise (elevate) your feet for 15 minutes, 3-4 times a day. ? Limit salt in your food. Prenatal care  Schedule your prenatal visits by the twelfth week of pregnancy.  Write down your questions. Take them to your prenatal visits.  Keep all your prenatal visits as told by your doctor. This is  important. Safety  Wear your seat belt at all times when driving.  Make a list of emergency phone numbers. The list should include numbers for family, friends, the hospital, and police and fire departments. General instructions  Ask your doctor for a referral to a local prenatal class. Begin classes no later than at the start of month 6 of your pregnancy.  Ask for help if you need counseling or if you need help with nutrition. Your doctor can give you advice or tell you where to go for help.  Do not use hot tubs, steam rooms, or saunas.  Do not douche or use tampons or scented sanitary pads.  Do not cross your legs for long periods of time.  Avoid all herbs and alcohol. Avoid drugs that are not approved by your doctor.  Do not use any tobacco products, including cigarettes, chewing tobacco, and electronic cigarettes. If you need help quitting, ask your doctor. You may get counseling or other support to help you quit.  Avoid cat litter boxes and soil used by cats. These carry germs that can cause birth defects in the baby and can cause a loss of your baby (miscarriage) or stillbirth.  Visit your  dentist. At home, brush your teeth with a soft toothbrush. Be gentle when you floss. Contact a doctor if:  You are dizzy.  You have mild cramps or pressure in your lower belly.  You have a nagging pain in your belly area.  You continue to feel sick to your stomach, you throw up, or you have watery poop (diarrhea).  You have a bad smelling fluid coming from your vagina.  You have pain when you pee (urinate).  You have increased puffiness (swelling) in your face, hands, legs, or ankles. Get help right away if:  You have a fever.  You are leaking fluid from your vagina.  You have spotting or bleeding from your vagina.  You have very bad belly cramping or pain.  You gain or lose weight rapidly.  You throw up blood. It may look like coffee grounds.  You are around people who  have [place] measles, fifth disease, or chickenpox.  You have a very bad headache.  You have shortness of breath.  You have any kind of trauma, such as from a fall or a car accident. Summary  The first trimester of pregnancy is from week 1 until the end of week 13 (months 1 through 3).  To take care of yourself and your unborn baby, you will need to eat healthy meals, take medicines only if your doctor tells you to do so, and do activities that are safe for you and your baby.  Keep all follow-up visits as told by your doctor. This is important as your doctor will have to ensure that your baby is healthy and growing well. This information is not intended to replace advice given to you by your health care provider. Make sure you discuss any questions you have with your health care provider. Document Released: 05/02/2008 Document Revised: 11/22/2016 Document Reviewed: 11/22/2016 Elsevier Interactive Patient Education  2017 [employer].

## 2018-04-17 ENCOUNTER — Ambulatory Visit (INDEPENDENT_AMBULATORY_CARE_PROVIDER_SITE_OTHER): Payer: Medicaid Other | Admitting: Advanced Practice Midwife

## 2018-04-17 ENCOUNTER — Encounter: Payer: Self-pay | Admitting: Advanced Practice Midwife

## 2018-04-17 ENCOUNTER — Other Ambulatory Visit: Payer: Self-pay | Admitting: Advanced Practice Midwife

## 2018-04-17 VITALS — BP 113/57 | HR 75 | Wt 198.4 lb

## 2018-04-17 DIAGNOSIS — Z124 Encounter for screening for malignant neoplasm of cervix: Secondary | ICD-10-CM

## 2018-04-17 DIAGNOSIS — Z6832 Body mass index (BMI) 32.0-32.9, adult: Secondary | ICD-10-CM

## 2018-04-17 DIAGNOSIS — Z3400 Encounter for supervision of normal first pregnancy, unspecified trimester: Secondary | ICD-10-CM | POA: Insufficient documentation

## 2018-04-17 DIAGNOSIS — Z3401 Encounter for supervision of normal first pregnancy, first trimester: Secondary | ICD-10-CM

## 2018-04-17 DIAGNOSIS — Z113 Encounter for screening for infections with a predominantly sexual mode of transmission: Secondary | ICD-10-CM

## 2018-04-17 DIAGNOSIS — Z363 Encounter for antenatal screening for malformations: Secondary | ICD-10-CM

## 2018-04-17 NOTE — Progress Notes (Signed)
  Subjective:    Mallory Harris is being seen today for her first obstetrical visit. She is late to care due to difficulties getting Medicaid. This is a planned pregnancy. She is at [redacted]w[redacted]d gestation by certain LMP. Had [place] ~5 weeks showing YS, but no fetal pole. Cannot use it for dating. Her obstetrical history is significant for obesity. Relationship with FOB: significant other, living together. Patient does intend to breast feed. Pregnancy history fully reviewed.  Patient reports no complaints.  Review of Systems:   Review of Systems  Constitutional: Negative for chills and fever.  Gastrointestinal: Negative for abdominal pain, nausea and vomiting.  Genitourinary: Negative for dysuria, vaginal bleeding and vaginal discharge.    Objective:     BP (!) 113/57   Pulse 75   Wt 198 lb 6.4 oz (90 kg)   LMP 12/14/2017 (Exact Date)   BMI 36.29 kg/m  Physical Exam  Nursing note and vitals reviewed. Constitutional: She is oriented to person, place, and time. She appears well-developed and well-nourished. No distress.  HENT:  Head: Atraumatic.  Eyes: No scleral icterus.  Cardiovascular: Normal rate, regular rhythm and normal heart sounds.  Respiratory: Effort normal and breath sounds normal. No respiratory distress. No breast swelling, tenderness, discharge or bleeding.  GI: Soft. She exhibits no distension. There is no tenderness. There is no rebound and no guarding.  Genitourinary: Vagina normal. There is no rash, tenderness or lesion on the right labia. There is no rash, tenderness or lesion on the left labia. Uterus is enlarged and fixed. Uterus is not tender. Cervix exhibits friability. Cervix exhibits no motion tenderness and no discharge. Right adnexum displays no mass, no tenderness and no fullness. Left adnexum displays no mass, no tenderness and no fullness. No tenderness or bleeding in the vagina. No vaginal discharge found.  Musculoskeletal: She exhibits no edema.  Neurological:  She is alert and oriented to person, place, and time. She has normal reflexes.  Skin: Skin is warm and dry.  Psychiatric: She has a normal mood and affect.    Maternal Exam:  Introitus: Vagina is negative for discharge.   FHR 145    Assessment:    Pregnancy: G1P0 Patient Active Problem List   Diagnosis Date Noted  . Supervision of normal first pregnancy, antepartum 04/17/2018     1. Supervision of normal first pregnancy, antepartum  - Obstetric Panel, Including HIV - CULTURE, URINE COMPREHENSIVE - [facility] AMB BABYSCRIPTS OPT IN - US OB Detail + 14 Wk; Future - Cytology - PAP ([place])  2. BMI 32.0-32.9,adult  - US OB Detail + 14 Wk; Future  3. Antenatal screening for malformation using ultrasonics  - US OB Detail + 14 Wk; Future    Plan:     Initial labs drawn. Prenatal vitamins. Problem list reviewed and updated. Panorama, AFP discussed: Planning, but will wait until next visit when Medicaid should be active. . Role of ultrasound in pregnancy discussed; fetal survey: ordered. Amniocentesis discussed: not indicated. Follow up in 4 weeks. Discussed clinic routines, schedule of care and testing, genetic screening options, involvement of students and residents under the direct supervision of APPs and doctors and presence of female providers. Pt verbalized understanding.   Manya Silvas 04/17/2018

## 2018-04-17 NOTE — Patient Instructions (Addendum)
[email or web address]  Second Trimester of Pregnancy The second trimester is from week 14 through week 27 (months 4 through 6). The second trimester is often a time when you feel your best. Your body has adjusted to being pregnant, and you begin to feel better physically. Usually, morning sickness has lessened or quit completely, you may have more energy, and you may have an increase in appetite. The second trimester is also a time when the fetus is growing rapidly. At the end of the sixth month, the fetus is about 9 inches long and weighs about 1 pounds. You will likely begin to feel the baby move (quickening) between 16 and 20 weeks of pregnancy. Body changes during your second trimester Your body continues to go through many changes during your second trimester. The changes vary from woman to woman.  Your weight will continue to increase. You will notice your lower abdomen bulging out.  You may begin to get stretch marks on your hips, abdomen, and breasts.  You may develop headaches that can be relieved by medicines. The medicines should be approved by your health care provider.  You may urinate more often because the fetus is pressing on your bladder.  You may develop or continue to have heartburn as a result of your pregnancy.  You may develop constipation because certain hormones are causing the muscles that push waste through your intestines to slow down.  You may develop hemorrhoids or swollen, bulging veins (varicose veins).  You may have back pain. This is caused by: ? Weight gain. ? Pregnancy hormones that are relaxing the joints in your pelvis. ? A shift in weight and the muscles that support your balance.  Your breasts will continue to grow and they will continue to become tender.  Your gums may bleed and may be sensitive to brushing and flossing.  Dark spots or blotches (chloasma, mask of pregnancy) may develop on your face. This will likely fade after the baby is  born.  A dark line from your belly button to the pubic area (linea nigra) may appear. This will likely fade after the baby is born.  You may have changes in your hair. These can include thickening of your hair, rapid growth, and changes in texture. Some women also have hair loss during or after pregnancy, or hair that feels dry or thin. Your hair will most likely return to normal after your baby is born.  What to expect at prenatal visits During a routine prenatal visit:  You will be weighed to make sure you and the fetus are growing normally.  Your blood pressure will be taken.  Your abdomen will be measured to track your baby's growth.  The fetal heartbeat will be listened to.  Any test results from the previous visit will be discussed.  Your health care provider may ask you:  How you are feeling.  If you are feeling the baby move.  If you have had any abnormal symptoms, such as leaking fluid, bleeding, severe headaches, or abdominal cramping.  If you are using any tobacco products, including cigarettes, chewing tobacco, and electronic cigarettes.  If you have any questions.  Other tests that may be performed during your second trimester include:  Blood tests that check for: ? Low iron levels (anemia). ? High blood sugar that affects pregnant women (gestational diabetes) between 19 and 28 weeks. ? Rh antibodies. This is to check for a protein on red blood cells (Rh factor).  Urine tests to check for infections,  diabetes, or protein in the urine.  An ultrasound to confirm the proper growth and development of the baby.  An amniocentesis to check for possible genetic problems.  Fetal screens for spina bifida and Down syndrome.  HIV (human immunodeficiency virus) testing. Routine prenatal testing includes screening for HIV, unless you choose not to have this test.  Follow these instructions at home: Medicines  Follow your health care provider's instructions regarding  medicine use. Specific medicines may be either safe or unsafe to take during pregnancy.  Take a prenatal vitamin that contains at least 600 micrograms (mcg) of folic acid.  If you develop constipation, try taking a stool softener if your health care provider approves. Eating and drinking  Eat a balanced diet that includes fresh fruits and vegetables, whole grains, good sources of protein such as meat, eggs, or tofu, and low-fat dairy. Your health care provider will help you determine the amount of weight gain that is right for you.  Avoid raw meat and uncooked cheese. These carry germs that can cause birth defects in the baby.  If you have low calcium intake from food, talk to your health care provider about whether you should take a daily calcium supplement.  Limit foods that are high in fat and processed sugars, such as fried and sweet foods.  To prevent constipation: ? Drink enough fluid to keep your urine clear or pale yellow. ? Eat foods that are high in fiber, such as fresh fruits and vegetables, whole grains, and beans. Activity  Exercise only as directed by your health care provider. Most women can continue their usual exercise routine during pregnancy. Try to exercise for 30 minutes at least 5 days a week. Stop exercising if you experience uterine contractions.  Avoid heavy lifting, wear low heel shoes, and practice good posture.  A sexual relationship may be continued unless your health care provider directs you otherwise. Relieving pain and discomfort  Wear a good support bra to prevent discomfort from breast tenderness.  Take warm sitz baths to soothe any pain or discomfort caused by hemorrhoids. Use hemorrhoid cream if your health care provider approves.  Rest with your legs elevated if you have leg cramps or low back pain.  If you develop varicose veins, wear support hose. Elevate your feet for 15 minutes, 3-4 times a day. Limit salt in your diet. Prenatal  Care  Write down your questions. Take them to your prenatal visits.  Keep all your prenatal visits as told by your health care provider. This is important. Safety  Wear your seat belt at all times when driving.  Make a list of emergency phone numbers, including numbers for family, friends, the hospital, and police and fire departments. General instructions  Ask your health care provider for a referral to a local prenatal education class. Begin classes no later than the beginning of month 6 of your pregnancy.  Ask for help if you have counseling or nutritional needs during pregnancy. Your health care provider can offer advice or refer you to specialists for help with various needs.  Do not use hot tubs, steam rooms, or saunas.  Do not douche or use tampons or scented sanitary pads.  Do not cross your legs for long periods of time.  Avoid cat litter boxes and soil used by cats. These carry germs that can cause birth defects in the baby and possibly loss of the fetus by miscarriage or stillbirth.  Avoid all smoking, herbs, alcohol, and unprescribed drugs. Chemicals in these  products can affect the formation and growth of the baby.  Do not use any products that contain nicotine or tobacco, such as cigarettes and e-cigarettes. If you need help quitting, ask your health care provider.  Visit your dentist if you have not gone yet during your pregnancy. Use a soft toothbrush to brush your teeth and be gentle when you floss. Contact a health care provider if:  You have dizziness.  You have mild pelvic cramps, pelvic pressure, or nagging pain in the abdominal area.  You have persistent nausea, vomiting, or diarrhea.  You have a bad smelling vaginal discharge.  You have pain when you urinate. Get help right away if:  You have a fever.  You are leaking fluid from your vagina.  You have spotting or bleeding from your vagina.  You have severe abdominal cramping or pain.  You have  rapid weight gain or weight loss.  You have shortness of breath with chest pain.  You notice sudden or extreme swelling of your face, hands, ankles, feet, or legs.  You have not felt your baby move in over an hour.  You have severe headaches that do not go away when you take medicine.  You have vision changes. Summary  The second trimester is from week 14 through week 27 (months 4 through 6). It is also a time when the fetus is growing rapidly.  Your body goes through many changes during pregnancy. The changes vary from woman to woman.  Avoid all smoking, herbs, alcohol, and unprescribed drugs. These chemicals affect the formation and growth your baby.  Do not use any tobacco products, such as cigarettes, chewing tobacco, and e-cigarettes. If you need help quitting, ask your health care provider.  Contact your health care provider if you have any questions. Keep all prenatal visits as told by your health care provider. This is important. This information is not intended to replace advice given to you by your health care provider. Make sure you discuss any questions you have with your health care provider. Document Released: 11/08/2001 Document Revised: 12/20/2016 Document Reviewed: 12/20/2016 Elsevier Interactive Patient Education  2018 [employer].   Breastfeeding Choosing to breastfeed is one of the best decisions you can make for yourself and your baby. A change in hormones during pregnancy causes your breasts to make breast milk in your milk-producing glands. Hormones prevent breast milk from being released before your baby is born. They also prompt milk flow after birth. Once breastfeeding has begun, thoughts of your baby, as well as his or her sucking or crying, can stimulate the release of milk from your milk-producing glands. Benefits of breastfeeding Research shows that breastfeeding offers many health benefits for infants and mothers. It also offers a cost-free and  convenient way to feed your baby. For your baby  Your first milk (colostrum) helps your baby's digestive system to function better.  Special cells in your milk (antibodies) help your baby to fight off infections.  Breastfed babies are less likely to develop asthma, allergies, obesity, or type 2 diabetes. They are also at lower risk for sudden infant death syndrome (SIDS).  Nutrients in breast milk are better able to meet your baby's needs compared to infant formula.  Breast milk improves your baby's brain development. For you  Breastfeeding helps to create a very special bond between you and your baby.  Breastfeeding is convenient. Breast milk costs nothing and is always available at the correct temperature.  Breastfeeding helps to burn calories. It helps  you to lose the weight that you gained during pregnancy.  Breastfeeding makes your uterus return faster to its size before pregnancy. It also slows bleeding (lochia) after you give birth.  Breastfeeding helps to lower your risk of developing type 2 diabetes, osteoporosis, rheumatoid arthritis, cardiovascular disease, and breast, ovarian, uterine, and endometrial cancer later in life. Breastfeeding basics Starting breastfeeding  Find a comfortable place to sit or lie down, with your neck and back well-supported.  Place a pillow or a rolled-up blanket under your baby to bring him or her to the level of your breast (if you are seated). Nursing pillows are specially designed to help support your arms and your baby while you breastfeed.  Make sure that your baby's tummy (abdomen) is facing your abdomen.  Gently massage your breast. With your fingertips, massage from the outer edges of your breast inward toward the nipple. This encourages milk flow. If your milk flows slowly, you may need to continue this action during the feeding.  Support your breast with 4 fingers underneath and your thumb above your nipple (make the letter "C" with  your hand). Make sure your fingers are well away from your nipple and your baby's mouth.  Stroke your baby's lips gently with your finger or nipple.  When your baby's mouth is open wide enough, quickly bring your baby to your breast, placing your entire nipple and as much of the areola as possible into your baby's mouth. The areola is the colored area around your nipple. ? More areola should be visible above your baby's upper lip than below the lower lip. ? Your baby's lips should be opened and extended outward (flanged) to ensure an adequate, comfortable latch. ? Your baby's tongue should be between his or her lower gum and your breast.  Make sure that your baby's mouth is correctly positioned around your nipple (latched). Your baby's lips should create a seal on your breast and be turned out (everted).  It is common for your baby to suck about 2-3 minutes in order to start the flow of breast milk. Latching Teaching your baby how to latch onto your breast properly is very important. An improper latch can cause nipple pain, decreased milk supply, and poor weight gain in your baby. Also, if your baby is not latched onto your nipple properly, he or she may swallow some air during feeding. This can make your baby fussy. Burping your baby when you switch breasts during the feeding can help to get rid of the air. However, teaching your baby to latch on properly is still the best way to prevent fussiness from swallowing air while breastfeeding. Signs that your baby has successfully latched onto your nipple  Silent tugging or silent sucking, without causing you pain. Infant's lips should be extended outward (flanged).  Swallowing heard between every 3-4 sucks once your milk has started to flow (after your let-down milk reflex occurs).  Muscle movement above and in front of his or her ears while sucking.  Signs that your baby has not successfully latched onto your nipple  Sucking sounds or smacking  sounds from your baby while breastfeeding.  Nipple pain.  If you think your baby has not latched on correctly, slip your finger into the corner of your baby's mouth to break the suction and place it between your baby's gums. Attempt to start breastfeeding again. Signs of successful breastfeeding Signs from your baby  Your baby will gradually decrease the number of sucks or will completely stop  sucking.  Your baby will fall asleep.  Your baby's body will relax.  Your baby will retain a small amount of milk in his or her mouth.  Your baby will let go of your breast by himself or herself.  Signs from you  Breasts that have increased in firmness, weight, and size 1-3 hours after feeding.  Breasts that are softer immediately after breastfeeding.  Increased milk volume, as well as a change in milk consistency and color by the fifth day of breastfeeding.  Nipples that are not sore, cracked, or bleeding.  Signs that your baby is getting enough milk  Wetting at least 1-2 diapers during the first 24 hours after birth.  Wetting at least 5-6 diapers every 24 hours for the first week after birth. The urine should be clear or pale yellow by the age of 5 days.  Wetting 6-8 diapers every 24 hours as your baby continues to grow and develop.  At least 3 stools in a 24-hour period by the age of 5 days. The stool should be soft and yellow.  At least 3 stools in a 24-hour period by the age of 7 days. The stool should be seedy and yellow.  No loss of weight greater than 10% of birth weight during the first 3 days of life.  Average weight gain of 4-7 oz (113-198 g) per week after the age of 4 days.  Consistent daily weight gain by the age of 5 days, without weight loss after the age of 2 weeks. After a feeding, your baby may spit up a small amount of milk. This is normal. Breastfeeding frequency and duration Frequent feeding will help you make more milk and can prevent sore nipples and  extremely full breasts (breast engorgement). Breastfeed when you feel the need to reduce the fullness of your breasts or when your baby shows signs of hunger. This is called "breastfeeding on demand." Signs that your baby is hungry include:  Increased alertness, activity, or restlessness.  Movement of the head from side to side.  Opening of the mouth when the corner of the mouth or cheek is stroked (rooting).  Increased sucking sounds, smacking lips, cooing, sighing, or squeaking.  Hand-to-mouth movements and sucking on fingers or hands.  Fussing or crying.  Avoid introducing a pacifier to your baby in the first 4-6 weeks after your baby is born. After this time, you may choose to use a pacifier. Research has shown that pacifier use during the first year of a baby's life decreases the risk of sudden infant death syndrome (SIDS). Allow your baby to feed on each breast as long as he or she wants. When your baby unlatches or falls asleep while feeding from the first breast, offer the second breast. Because newborns are often sleepy in the first few weeks of life, you may need to awaken your baby to get him or her to feed. Breastfeeding times will vary from baby to baby. However, the following rules can serve as a guide to help you make sure that your baby is properly fed:  Newborns (babies 11 weeks of age or younger) may breastfeed every 1-3 hours.  Newborns should not go without breastfeeding for longer than 3 hours during the day or 5 hours during the night.  You should breastfeed your baby a minimum of 8 times in a 24-hour period.  Breast milk pumping Pumping and storing breast milk allows you to make sure that your baby is exclusively fed your breast milk, even  at times when you are unable to breastfeed. This is especially important if you go back to work while you are still breastfeeding, or if you are not able to be present during feedings. Your lactation consultant can help you find a  method of pumping that works best for you and give you guidelines about how long it is safe to store breast milk. Caring for your breasts while you breastfeed Nipples can become dry, cracked, and sore while breastfeeding. The following recommendations can help keep your breasts moisturized and healthy:  Avoid using soap on your nipples.  Wear a supportive bra designed especially for nursing. Avoid wearing underwire-style bras or extremely tight bras (sports bras).  Air-dry your nipples for 3-4 minutes after each feeding.  Use only cotton bra pads to absorb leaked breast milk. Leaking of breast milk between feedings is normal.  Use lanolin on your nipples after breastfeeding. Lanolin helps to maintain your skin's normal moisture barrier. Pure lanolin is not harmful (not toxic) to your baby. You may also hand express a few drops of breast milk and gently massage that milk into your nipples and allow the milk to air-dry.  In the first few weeks after giving birth, some women experience breast engorgement. Engorgement can make your breasts feel heavy, warm, and tender to the touch. Engorgement peaks within 3-5 days after you give birth. The following recommendations can help to ease engorgement:  Completely empty your breasts while breastfeeding or pumping. You may want to start by applying warm, moist heat (in the shower or with warm, water-soaked hand towels) just before feeding or pumping. This increases circulation and helps the milk flow. If your baby does not completely empty your breasts while breastfeeding, pump any extra milk after he or she is finished.  Apply ice packs to your breasts immediately after breastfeeding or pumping, unless this is too uncomfortable for you. To do this: ? Put ice in a plastic bag. ? Place a towel between your skin and the bag. ? Leave the ice on for 20 minutes, 2-3 times a day.  Make sure that your baby is latched on and positioned properly while  breastfeeding.  If engorgement persists after 48 hours of following these recommendations, contact your health care provider or a Science writer. Overall health care recommendations while breastfeeding  Eat 3 healthy meals and 3 snacks every day. Well-nourished mothers who are breastfeeding need an additional 450-500 calories a day. You can meet this requirement by increasing the amount of a balanced diet that you eat.  Drink enough water to keep your urine pale yellow or clear.  Rest often, relax, and continue to take your prenatal vitamins to prevent fatigue, stress, and low vitamin and mineral levels in your body (nutrient deficiencies).  Do not use any products that contain nicotine or tobacco, such as cigarettes and e-cigarettes. Your baby may be harmed by chemicals from cigarettes that pass into breast milk and exposure to secondhand smoke. If you need help quitting, ask your health care provider.  Avoid alcohol.  Do not use illegal drugs or marijuana.  Talk with your health care provider before taking any medicines. These include over-the-counter and prescription medicines as well as vitamins and herbal supplements. Some medicines that may be harmful to your baby can pass through breast milk.  It is possible to become pregnant while breastfeeding. If birth control is desired, ask your health care provider about options that will be safe while breastfeeding your baby. Where to find more  information: [employer]: www.llli.org Contact a health care provider if:  You feel like you want to stop breastfeeding or have become frustrated with breastfeeding.  Your nipples are cracked or bleeding.  Your breasts are red, tender, or warm.  You have: ? Painful breasts or nipples. ? A swollen area on either breast. ? A fever or chills. ? Nausea or vomiting. ? Drainage other than breast milk from your nipples.  Your breasts do not become full before feedings by the  fifth day after you give birth.  You feel sad and depressed.  Your baby is: ? Too sleepy to eat well. ? Having trouble sleeping. ? More than 42 week old and wetting fewer than 6 diapers in a 24-hour period. ? Not gaining weight by 46 days of age.  Your baby has fewer than 3 stools in a 24-hour period.  Your baby's skin or the white parts of his or her eyes become yellow. Get help right away if:  Your baby is overly tired (lethargic) and does not want to wake up and feed.  Your baby develops an unexplained fever. Summary  Breastfeeding offers many health benefits for infant and mothers.  Try to breastfeed your infant when he or she shows early signs of hunger.  Gently tickle or stroke your baby's lips with your finger or nipple to allow the baby to open his or her mouth. Bring the baby to your breast. Make sure that much of the areola is in your baby's mouth. Offer one side and burp the baby before you offer the other side.  Talk with your health care provider or lactation consultant if you have questions or you face problems as you breastfeed. This information is not intended to replace advice given to you by your health care provider. Make sure you discuss any questions you have with your health care provider. Document Released: 11/14/2005 Document Revised: 12/16/2016 Document Reviewed: 12/16/2016 Elsevier Interactive Patient Education  2018 [employer] Vaccine Pregnancy Get the Whooping Cough Vaccine While You Are Pregnant (CDC)  It is important for women to get the whooping cough vaccine in the third trimester of each pregnancy. Vaccines are the best way to prevent this disease. There are 2 different whooping cough vaccines. Both vaccines combine protection against whooping cough, tetanus and diphtheria, but they are for different age groups: Tdap: for everyone 11 years or older, including pregnant women  DTaP: for children 2 months through 69 years of age  You need the  whooping cough vaccine during each of your pregnancies The recommended time to get the shot is during your 27th through 36th week of pregnancy, preferably during the earlier part of this time period. The Centers for Disease Control and Prevention (CDC) recommends that pregnant women receive the whooping cough vaccine for adolescents and adults (called Tdap vaccine) during the third trimester of each pregnancy. The recommended time to get the shot is during your 27th through 36th week of pregnancy, preferably during the earlier part of this time period. This replaces the original recommendation that pregnant women get the vaccine only if they had not previously received it. The [employer] and [employer]nec[employer] and the [employer] support this recommendation.  You should get the whooping cough vaccine while pregnant to pass protection to your baby frame support disabled and/or not supported in this browser  Learn why Mickel Baas decided to get the whooping cough vaccine in her 3rd trimester of pregnancy and how  her baby girl was born with some protection against the disease. Also available on YouTube. After receiving the whooping cough vaccine, your body will create protective antibodies (proteins produced by the body to fight off diseases) and pass some of them to your baby before birth. These antibodies provide your baby some short-term protection against whooping cough in early life. These antibodies can also protect your baby from some of the more serious complications that come along with whooping cough. Your protective antibodies are at their highest about 2 weeks after getting the vaccine, but it takes time to pass them to your baby. So the preferred time to get the whooping cough vaccine is early in your third trimester. The amount of whooping cough antibodies in your body decreases over time. That is why CDC recommends you get a whooping cough vaccine during each  pregnancy. Doing so allows each of your babies to get the greatest number of protective antibodies from you. This means each of your babies will get the best protection possible against this disease.  Getting the whooping cough vaccine while pregnant is better than getting the vaccine after you give birth Whooping cough vaccination during pregnancy is ideal so your baby will have short-term protection as soon as he is born. This early protection is important because your baby will not start getting his whooping cough vaccines until he is 2 months old. These first few months of life are when your baby is at greatest risk for catching whooping cough. This is also when he's at greatest risk for having severe, potentially life-threating complications from the infection. To avoid that gap in protection, it is best to get a whooping cough vaccine during pregnancy. You will then pass protection to your baby before he is born. To continue protecting your baby, he should get whooping cough vaccines starting at 2 months old. You may never have gotten the Tdap vaccine before and did not get it during this pregnancy. If so, you should make sure to get the vaccine immediately after you give birth, before leaving the hospital or birthing center. It will take about 2 weeks before your body develops protection (antibodies) in response to the vaccine. Once you have protection from the vaccine, you are less likely to give whooping cough to your newborn while caring for him. But remember, your baby will still be at risk for catching whooping cough from others. A recent study looked to see how effective Tdap was at preventing whooping cough in babies whose mothers got the vaccine while pregnant or in the hospital after giving birth. The study found that getting Tdap between 27 through 36 weeks of pregnancy is 85% more effective at preventing whooping cough in babies younger than 2 months old. Blood tests cannot tell if you need a  whooping cough vaccine There are no blood tests that can tell you if you have enough antibodies in your body to protect yourself or your baby against whooping cough. Even if you have been sick with whooping cough in the past or previously received the vaccine, you still should get the vaccine during each pregnancy. Breastfeeding may pass some protective antibodies onto your baby By breastfeeding, you may pass some antibodies you have made in response to the vaccine to your baby. When you get a whooping cough vaccine during your pregnancy, you will have antibodies in your breast milk that you can share with your baby as soon as your milk comes in. However, your baby will not get protective  antibodies immediately if you wait to get the whooping cough vaccine until after delivering your baby. This is because it takes about 2 weeks for your body to create antibodies. Learn more about the health benefits of breastfeeding.

## 2018-04-18 LAB — OBSTETRIC PANEL, INCLUDING HIV
Antibody Screen: NEGATIVE
Basophils Absolute: 0 10*3/uL (ref 0.0–0.2)
Basos: 0 %
EOS (ABSOLUTE): 0.1 10*3/uL (ref 0.0–0.4)
EOS: 1 %
HEMOGLOBIN: 9.9 g/dL — AB (ref 11.1–15.9)
HEP B S AG: NEGATIVE
HIV Screen 4th Generation wRfx: NONREACTIVE
Hematocrit: 30.1 % — ABNORMAL LOW (ref 34.0–46.6)
IMMATURE GRANS (ABS): 0 10*3/uL (ref 0.0–0.1)
Immature Granulocytes: 0 %
LYMPHS: 21 %
Lymphocytes Absolute: 1.4 10*3/uL (ref 0.7–3.1)
MCH: 26.8 pg (ref 26.6–33.0)
MCHC: 32.9 g/dL (ref 31.5–35.7)
MCV: 81 fL (ref 79–97)
MONOCYTES: 10 %
Monocytes Absolute: 0.6 10*3/uL (ref 0.1–0.9)
NEUTROS PCT: 68 %
Neutrophils Absolute: 4.4 10*3/uL (ref 1.4–7.0)
Platelets: 186 10*3/uL (ref 150–450)
RBC: 3.7 x10E6/uL — ABNORMAL LOW (ref 3.77–5.28)
RDW: 13.9 % (ref 12.3–15.4)
RH TYPE: POSITIVE
RPR: NONREACTIVE
Rubella Antibodies, IGG: 1.73 index (ref >0.99)
WBC: 6.4 10*3/uL (ref 3.4–10.8)

## 2018-04-18 LAB — CYTOLOGY - PAP
Chlamydia: NEGATIVE
Diagnosis: NEGATIVE
NEISSERIA GONORRHEA: NEGATIVE

## 2018-04-19 ENCOUNTER — Encounter: Payer: Self-pay | Admitting: Advanced Practice Midwife

## 2018-04-19 DIAGNOSIS — O99019 Anemia complicating pregnancy, unspecified trimester: Secondary | ICD-10-CM | POA: Insufficient documentation

## 2018-04-19 LAB — CULTURE, URINE COMPREHENSIVE

## 2018-05-08 ENCOUNTER — Other Ambulatory Visit: Payer: Self-pay | Admitting: Advanced Practice Midwife

## 2018-05-08 ENCOUNTER — Ambulatory Visit (HOSPITAL_COMMUNITY)
Admission: RE | Admit: 2018-05-08 | Discharge: 2018-05-08 | Disposition: A | Discharge status: Home / Self Care | Payer: Medicaid Other | Source: Ambulatory Visit | Attending: Advanced Practice Midwife | Admitting: Advanced Practice Midwife

## 2018-05-08 ENCOUNTER — Encounter (HOSPITAL_COMMUNITY): Payer: Self-pay

## 2018-05-08 DIAGNOSIS — O3413 Maternal care for benign tumor of corpus uteri, third trimester: Secondary | ICD-10-CM

## 2018-05-08 DIAGNOSIS — Z3A20 20 weeks gestation of pregnancy: Secondary | ICD-10-CM | POA: Insufficient documentation

## 2018-05-08 DIAGNOSIS — Z141 Cystic fibrosis carrier: Secondary | ICD-10-CM

## 2018-05-08 DIAGNOSIS — O09523 Supervision of elderly multigravida, third trimester: Secondary | ICD-10-CM

## 2018-05-08 DIAGNOSIS — Z3A31 31 weeks gestation of pregnancy: Secondary | ICD-10-CM

## 2018-05-08 DIAGNOSIS — D259 Leiomyoma of uterus, unspecified: Secondary | ICD-10-CM

## 2018-05-08 DIAGNOSIS — O10919 Unspecified pre-existing hypertension complicating pregnancy, unspecified trimester: Secondary | ICD-10-CM

## 2018-05-08 DIAGNOSIS — Z363 Encounter for antenatal screening for malformations: Secondary | ICD-10-CM | POA: Insufficient documentation

## 2018-05-08 DIAGNOSIS — O99212 Obesity complicating pregnancy, second trimester: Secondary | ICD-10-CM | POA: Diagnosis not present

## 2018-05-08 DIAGNOSIS — Z3400 Encounter for supervision of normal first pregnancy, unspecified trimester: Secondary | ICD-10-CM

## 2018-05-08 DIAGNOSIS — O09893 Supervision of other high risk pregnancies, third trimester: Secondary | ICD-10-CM

## 2018-05-08 HISTORY — DX: Other specified health status: Z78.9

## 2018-05-09 ENCOUNTER — Other Ambulatory Visit (HOSPITAL_COMMUNITY): Payer: Self-pay | Admitting: *Deleted

## 2018-05-09 DIAGNOSIS — Z362 Encounter for other antenatal screening follow-up: Secondary | ICD-10-CM

## 2018-05-15 ENCOUNTER — Ambulatory Visit (INDEPENDENT_AMBULATORY_CARE_PROVIDER_SITE_OTHER): Payer: Medicaid Other | Admitting: Advanced Practice Midwife

## 2018-05-15 VITALS — BP 108/65 | HR 62 | Wt 197.8 lb

## 2018-05-15 DIAGNOSIS — Z3402 Encounter for supervision of normal first pregnancy, second trimester: Secondary | ICD-10-CM

## 2018-05-15 DIAGNOSIS — Z3400 Encounter for supervision of normal first pregnancy, unspecified trimester: Secondary | ICD-10-CM

## 2018-05-15 MED ORDER — FERROUS SULFATE 325 (65 FE) MG PO TABS: 325.0000 mg | ORAL_TABLET | Freq: Two times a day (BID) | ORAL | 1 refills | Status: DC

## 2018-05-16 ENCOUNTER — Encounter: Payer: Self-pay | Admitting: Advanced Practice Midwife

## 2018-05-16 NOTE — Patient Instructions (Signed)
Second Trimester of Pregnancy The second trimester is from week 13 through week 28, month 4 through 6. This is often the time in pregnancy that you feel your best. Often times, morning sickness has lessened or quit. You may have more energy, and you may get hungry more often. Your unborn baby (fetus) is growing rapidly. At the end of the sixth month, he or she is about 9 inches long and weighs about 1 pounds. You will likely feel the baby move (quickening) between 18 and 20 weeks of pregnancy. Follow these instructions at home:  Avoid all smoking, herbs, and alcohol. Avoid drugs not approved by your doctor.  Do not use any tobacco products, including cigarettes, chewing tobacco, and electronic cigarettes. If you need help quitting, ask your doctor. You may get counseling or other support to help you quit.  Only take medicine as told by your doctor. Some medicines are safe and some are not during pregnancy.  Exercise only as told by your doctor. Stop exercising if you start having cramps.  Eat regular, healthy meals.  Wear a good support bra if your breasts are tender.  Do not use hot tubs, steam rooms, or saunas.  Wear your seat belt when driving.  Avoid raw meat, uncooked cheese, and liter boxes and soil used by cats.  Take your prenatal vitamins.  Take 1500-2000 milligrams of calcium daily starting at the 20th week of pregnancy until you deliver your baby.  Try taking medicine that helps you poop (stool softener) as needed, and if your doctor approves. Eat more fiber by eating fresh fruit, vegetables, and whole grains. Drink enough fluids to keep your pee (urine) clear or pale yellow.  Take warm water baths (sitz baths) to soothe pain or discomfort caused by hemorrhoids. Use hemorrhoid cream if your doctor approves.  If you have puffy, bulging veins (varicose veins), wear support hose. Raise (elevate) your feet for 15 minutes, 3-4 times a day. Limit salt in your diet.  Avoid heavy  lifting, wear low heals, and sit up straight.  Rest with your legs raised if you have leg cramps or low back pain.  Visit your dentist if you have not gone during your pregnancy. Use a soft toothbrush to brush your teeth. Be gentle when you floss.  You can have sex (intercourse) unless your doctor tells you not to.  Go to your doctor visits. Get help if:  You feel dizzy.  You have mild cramps or pressure in your lower belly (abdomen).  You have a nagging pain in your belly area.  You continue to feel sick to your stomach (nauseous), throw up (vomit), or have watery poop (diarrhea).  You have bad smelling fluid coming from your vagina.  You have pain with peeing (urination). Get help right away if:  You have a fever.  You are leaking fluid from your vagina.  You have spotting or bleeding from your vagina.  You have severe belly cramping or pain.  You lose or gain weight rapidly.  You have trouble catching your breath and have chest pain.  You notice sudden or extreme puffiness (swelling) of your face, hands, ankles, feet, or legs.  You have not felt the baby move in over an hour.  You have severe headaches that do not go away with medicine.  You have vision changes. This information is not intended to replace advice given to you by your health care provider. Make sure you discuss any questions you have with your health care   provider. Document Released: 02/08/2010 Document Revised: 04/21/2016 Document Reviewed: 01/15/2013 Elsevier Interactive Patient Education  2017 [employer]  

## 2018-05-16 NOTE — Progress Notes (Signed)
   PRENATAL VISIT NOTE  Subjective:  Mallory Harris is a 27 y.o. G1P0 at [redacted]w[redacted]d being seen today for ongoing prenatal care.  She is currently monitored for the following issues for this low-risk pregnancy and has Supervision of normal first pregnancy, antepartum and Anemia affecting pregnancy on their problem list.  Patient reports no complaints.  Contractions: Not present. Vag. Bleeding: None.  Movement: Present. Denies leaking of fluid.   The following portions of the patient's history were reviewed and updated as appropriate: allergies, current medications, past family history, past medical history, past social history, past surgical history and problem list. Problem list updated.  Objective:   Vitals:   05/15/18 0825  BP: 108/65  Pulse: 62  Weight: 197 lb 12.8 oz (89.7 kg)    Fetal Status: Fetal Heart Rate (bpm): 140   Movement: Present     General:  Alert, oriented and cooperative. Patient is in no acute distress.  Skin: Skin is warm and dry. No rash noted.   Cardiovascular: Normal heart rate noted  Respiratory: Normal respiratory effort, no problems with respiration noted  Abdomen: Soft, gravid, appropriate for gestational age.  Pain/Pressure: Present     Pelvic: Cervical exam deferred        Extremities: Normal range of motion.  Edema: None  Mental Status: Normal mood and affect. Normal behavior. Normal judgment and thought content.   Assessment and Plan:  Pregnancy: G1P0 at [redacted]w[redacted]d  1. Supervision of normal first pregnancy, antepartum      Finish bloodwork today      Reviewed results of prior lab work      Pap normal      US WNL but incomplete, f/u scheduled for July - Hemoglobinopathy Evaluation - Genetic Screening  Preterm labor symptoms and general obstetric precautions including but not limited to vaginal bleeding, contractions, leaking of fluid and fetal movement were reviewed in detail with the patient. Please refer to After Visit Summary for other counseling  recommendations.    Future Appointments  Date Time Provider [place]  06/05/2018  8:30 AM [facility] [place] 1 [facility]  06/22/2018  8:30 AM Sloan Leiter, MD [facility] None    Hansel Feinstein, CNM

## 2018-05-16 NOTE — Progress Notes (Deleted)
   PRENATAL VISIT NOTE  Subjective:  Mallory Harris is a 27 y.o. G1P0 at [redacted]w[redacted]d being seen today for ongoing prenatal care.  She is currently monitored for the following issues for this {Blank single:19197::"high-risk","low-risk"} pregnancy and has Supervision of normal first pregnancy, antepartum and Anemia affecting pregnancy on their problem list.  Patient reports {sx:14538}.  Contractions: Not present. Vag. Bleeding: None.  Movement: Present. Denies leaking of fluid.   The following portions of the patient's history were reviewed and updated as appropriate: allergies, current medications, past family history, past medical history, past social history, past surgical history and problem list. Problem list updated.  Objective:   Vitals:   05/15/18 0825  BP: 108/65  Pulse: 62  Weight: 197 lb 12.8 oz (89.7 kg)    Fetal Status: Fetal Heart Rate (bpm): 140   Movement: Present     General:  Alert, oriented and cooperative. Patient is in no acute distress.  Skin: Skin is warm and dry. No rash noted.   Cardiovascular: Normal heart rate noted  Respiratory: Normal respiratory effort, no problems with respiration noted  Abdomen: Soft, gravid, appropriate for gestational age.  Pain/Pressure: Present     Pelvic: {Blank single:19197::"Cervical exam performed","Cervical exam deferred"}        Extremities: Normal range of motion.  Edema: None  Mental Status: Normal mood and affect. Normal behavior. Normal judgment and thought content.   Assessment and Plan:  Pregnancy: G1P0 at [redacted]w[redacted]d  1. Supervision of normal first pregnancy, antepartum *** - Hemoglobinopathy Evaluation - Genetic Screening  {Blank single:19197::"Term","Preterm"} labor symptoms and general obstetric precautions including but not limited to vaginal bleeding, contractions, leaking of fluid and fetal movement were reviewed in detail with the patient. Please refer to After Visit Summary for other counseling recommendations.  No  follow-ups on file.  Future Appointments  Date Time Provider [place]  06/05/2018  8:30 AM [facility] [place] 1 [facility]  06/22/2018  8:30 AM Sloan Leiter, MD [facility] None    Hansel Feinstein, CNM

## 2018-05-17 LAB — HEMOGLOBINOPATHY EVALUATION
FERRITIN: 71 ng/mL (ref 15–150)
HEMOGLOBIN: 10.1 g/dL — AB (ref 11.1–15.9)
HGB A: 97.3 % (ref 96.4–98.8)
HGB C: 0 %
HGB F QUANT: 0 % (ref 0.0–2.0)
Hematocrit: 31.1 % — ABNORMAL LOW (ref 34.0–46.6)
Hgb A2 Quant: 2.7 % (ref 1.8–3.2)
Hgb S: 0 %
Hgb Solubility: NEGATIVE
Hgb Variant: 0 %
MCH: 27.4 pg (ref 26.6–33.0)
MCHC: 32.5 g/dL (ref 31.5–35.7)
MCV: 84 fL (ref 79–97)
Platelets: 176 10*3/uL (ref 150–450)
RBC: 3.69 x10E6/uL — ABNORMAL LOW (ref 3.77–5.28)
RDW: 14.1 % (ref 12.3–15.4)
WBC: 5.4 10*3/uL (ref 3.4–10.8)

## 2018-06-05 ENCOUNTER — Ambulatory Visit (HOSPITAL_COMMUNITY): Payer: Medicaid Other

## 2018-06-22 ENCOUNTER — Ambulatory Visit (INDEPENDENT_AMBULATORY_CARE_PROVIDER_SITE_OTHER): Payer: Medicaid Other | Admitting: Obstetrics and Gynecology

## 2018-06-22 ENCOUNTER — Other Ambulatory Visit (HOSPITAL_COMMUNITY): Payer: Self-pay | Admitting: Maternal and Fetal Medicine

## 2018-06-22 ENCOUNTER — Encounter: Payer: Self-pay | Admitting: Obstetrics and Gynecology

## 2018-06-22 ENCOUNTER — Ambulatory Visit (HOSPITAL_COMMUNITY)
Admission: RE | Admit: 2018-06-22 | Discharge: 2018-06-22 | Disposition: A | Discharge status: Home / Self Care | Payer: Medicaid Other | Source: Ambulatory Visit | Attending: Obstetrics & Gynecology | Admitting: Obstetrics & Gynecology

## 2018-06-22 VITALS — BP 123/74 | HR 76 | Wt 206.0 lb

## 2018-06-22 DIAGNOSIS — Z23 Encounter for immunization: Secondary | ICD-10-CM | POA: Diagnosis not present

## 2018-06-22 DIAGNOSIS — Z3A27 27 weeks gestation of pregnancy: Secondary | ICD-10-CM

## 2018-06-22 DIAGNOSIS — O99012 Anemia complicating pregnancy, second trimester: Secondary | ICD-10-CM

## 2018-06-22 DIAGNOSIS — O99212 Obesity complicating pregnancy, second trimester: Secondary | ICD-10-CM | POA: Diagnosis not present

## 2018-06-22 DIAGNOSIS — IMO0002 Reserved for concepts with insufficient information to code with codable children: Secondary | ICD-10-CM

## 2018-06-22 DIAGNOSIS — D649 Anemia, unspecified: Secondary | ICD-10-CM

## 2018-06-22 DIAGNOSIS — Z0489 Encounter for examination and observation for other specified reasons: Secondary | ICD-10-CM

## 2018-06-22 DIAGNOSIS — Z362 Encounter for other antenatal screening follow-up: Secondary | ICD-10-CM | POA: Insufficient documentation

## 2018-06-22 DIAGNOSIS — Z3402 Encounter for supervision of normal first pregnancy, second trimester: Secondary | ICD-10-CM

## 2018-06-22 DIAGNOSIS — Z3400 Encounter for supervision of normal first pregnancy, unspecified trimester: Secondary | ICD-10-CM

## 2018-06-22 MED ORDER — TETANUS-DIPHTH-ACELL PERTUSSIS 5-2.5-18.5 LF-MCG/0.5 IM SUSP: 0.5000 mL | Freq: Once | INTRAMUSCULAR | Status: DC

## 2018-06-22 NOTE — Progress Notes (Signed)
Patient will pick one day next week to come do her 28 week labs for gestational diabetes testing. Kathrene Alu RN

## 2018-06-22 NOTE — Patient Instructions (Signed)
Intrauterine Device Information An intrauterine device (IUD) is inserted into your uterus to prevent pregnancy. There are two types of IUDs available: Copper IUD-This type of IUD is wrapped in copper wire and is placed inside the uterus. Copper makes the uterus and fallopian tubes produce a fluid that kills sperm. The copper IUD can stay in place for 10 years. Hormone IUD-This type of IUD contains the hormone progestin (synthetic progesterone). The hormone thickens the cervical mucus and prevents sperm from entering the uterus. It also thins the uterine lining to prevent implantation of a fertilized egg. The hormone can weaken or kill the sperm that get into the uterus. One type of hormone IUD can stay in place for 5 years, and another type can stay in place for 3 years.  Your health care provider will make sure you are a good candidate for a contraceptive IUD. Discuss with your health care provider the possible side effects. Advantages of an intrauterine device IUDs are highly effective, reversible, long acting, and low maintenance. There are no estrogen-related side effects. An IUD can be used when breastfeeding. IUDs are not associated with weight gain. The copper IUD works immediately after insertion. The hormone IUD works right away if inserted within 7 days of your period starting. You will need to use a backup method of birth control for 7 days if the hormone IUD is inserted at any other time in your cycle. The copper IUD does not interfere with your female hormones. The hormone IUD can make heavy menstrual periods lighter and decrease cramping. The hormone IUD can be used for 3 or 5 years. The copper IUD can be used for 10 years. Disadvantages of an intrauterine device The hormone IUD can be associated with irregular bleeding patterns. The copper IUD can make your menstrual flow heavier and more painful. You may experience cramping and vaginal bleeding after insertion. This information  is not intended to replace advice given to you by your health care provider. Make sure you discuss any questions you have with your health care provider. Document Released: 10/18/2004 Document Revised: 04/21/2016 Document Reviewed: 05/05/2013 Elsevier Interactive Patient Education  2017 [employer]. 1.

## 2018-06-22 NOTE — Progress Notes (Signed)
   PRENATAL VISIT NOTE  Subjective:  Mallory Harris is a 27 y.o. G1P0 at [redacted]w[redacted]d being seen today for ongoing prenatal care.  She is currently monitored for the following issues for this low-risk pregnancy and has Supervision of normal first pregnancy, antepartum and Anemia affecting pregnancy on their problem list.  Patient reports occasional cramping.  Contractions: Not present. Vag. Bleeding: None.  Movement: Present. Denies leaking of fluid.   The following portions of the patient's history were reviewed and updated as appropriate: allergies, current medications, past family history, past medical history, past social history, past surgical history and problem list. Problem list updated.  Objective:   Vitals:   06/22/18 0837  BP: 123/74  Pulse: 76  Weight: 206 lb (93.4 kg)    Fetal Status:     Movement: Present     General:  Alert, oriented and cooperative. Patient is in no acute distress.  Skin: Skin is warm and dry. No rash noted.   Cardiovascular: Normal heart rate noted  Respiratory: Normal respiratory effort, no problems with respiration noted  Abdomen: Soft, gravid, appropriate for gestational age.  Pain/Pressure: Absent     Pelvic: Cervical exam deferred        Extremities: Normal range of motion.  Edema: None  Mental Status: Normal mood and affect. Normal behavior. Normal judgment and thought content.   Assessment and Plan:  Pregnancy: G1P0 at [redacted]w[redacted]d  1. Supervision of normal first pregnancy, antepartum Will return next week for 2 hr GTT Considering IUD, reviewed in detail today, answered all questions  2. Anemia affecting pregnancy in second trimester On iron  Preterm labor symptoms and general obstetric precautions including but not limited to vaginal bleeding, contractions, leaking of fluid and fetal movement were reviewed in detail with the patient. Please refer to After Visit Summary for other counseling recommendations.  Return in about 2 weeks (around  07/06/2018) for OB visit.  Future Appointments  Date Time Provider [place]  06/22/2018 10:45 AM [facility] [place] 2 [facility]    Sloan Leiter, MD

## 2018-07-24 ENCOUNTER — Encounter: Payer: Self-pay | Admitting: Advanced Practice Midwife

## 2018-07-24 ENCOUNTER — Ambulatory Visit (INDEPENDENT_AMBULATORY_CARE_PROVIDER_SITE_OTHER): Payer: Medicaid Other | Admitting: Advanced Practice Midwife

## 2018-07-24 VITALS — BP 107/58 | HR 84 | Wt 205.0 lb

## 2018-07-24 DIAGNOSIS — Z3400 Encounter for supervision of normal first pregnancy, unspecified trimester: Secondary | ICD-10-CM | POA: Diagnosis not present

## 2018-07-24 NOTE — Patient Instructions (Addendum)
Places to have your son circumcised:    [facility] [phone] while you are in hospital  [facility] [phone] $244 by 4 wks  [facility] [phone] $175 by 2 wks  [facility] [phone] $250 by 7 days [facility] [phone] $269 by 4 wks  These prices sometimes change but are roughly what you can expect to pay. Please call and confirm pricing.   Circumcision is considered an elective/non-medically necessary procedure. There are many reasons parents decide to have their sons circumsized. During the first year of life circumcised males have a reduced risk of urinary tract infections but after this year the rates between circumcised males and uncircumcised males are the same.  It is safe to have your son circumcised outside of the hospital and the places above perform them regularly.   Deciding about Circumcision in Baby Boys  (Up-to-date The Basics)  What is circumcision?  Circumcision is a surgery that removes the skin that covers the tip of the penis, called the "foreskin" Circumcision is usually done when a boy is between 34 and 23 days old. In the [place], circumcision is common. In some other countries, fewer boys are circumcised. Circumcision is a common tradition in some religions.  Should I have my baby boy circumcised?  There is no easy answer. Circumcision has some benefits. But it also has risks. After talking with your doctor, you will have to decide for yourself what is right for your family.  What are the benefits of circumcision?  Circumcised boys seem to have slightly lower rates of: ?Urinary tract infections ?Swelling of the opening at the tip of the penis Circumcised men seem to have slightly lower rates of: ?Urinary tract infections ?Swelling of the opening at the tip of the penis ?Penis  cancer ?HIV and other infections that you catch during sex ?Cervical cancer in the women they have sex with Even so, in the [place], the risks of these problems are small - even in boys and men who have not been circumcised. Plus, boys and men who are not circumcised can reduce these extra risks by: ?Cleaning their penis well ?Using condoms during sex  What are the risks of circumcision?  Risks include: ?Bleeding or infection from the surgery ?Damage to or amputation of the penis ?A chance that the doctor will cut off too much or not enough of the foreskin ?A chance that sex won't feel as good later in life Only about 1 out of every 200 circumcisions leads to problems. There is also a chance that your health insurance won't pay for circumcision.  How is circumcision done in baby boys?  First, the baby gets medicine for pain relief. This might be a cream on the skin or a shot into the base of the penis. Next, the doctor cleans the baby's penis well. Then he or she uses special tools to cut off the foreskin. Finally, the doctor wraps a bandage (called gauze) around the baby's penis. If you have your baby circumcised, his doctor or nurse will give you instructions on how to care for him after the surgery. It is important that you follow those instructions carefully.   Glucose Tolerance Test During Pregnancy The glucose tolerance test (GTT) is a blood test used to determine if you have developed a type of diabetes during pregnancy (gestational diabetes). This is when your body does not properly process sugar (glucose) in the food you eat, resulting in high blood glucose levels. Typically, a GTT is done after you  have had a 1-hour glucose test with results that indicate you possibly have gestational diabetes. It may also be done if:  You have a history of giving birth to very large babies or have experienced repeated fetal loss (stillbirth).  You have signs and symptoms of diabetes, such  as: ? Changes in your vision. ? Tingling or numbness in your hands or feet. ? Changes in hunger, thirst, and urination not otherwise explained by your pregnancy.  The GTT lasts about 3 hours. You will be given a sugar-water solution to drink at the beginning of the test. You will have blood drawn before you drink the solution and then again 1, 2, and 3 hours after you drink it. You will not be allowed to eat or drink anything else during the test. You must remain at the testing location to make sure that your blood is drawn on time. You should also avoid exercising during the test, because exercise can alter test results. How do I prepare for this test? Eat normally for 3 days prior to the GTT test, including having plenty of carbohydrate-rich foods. Do not eat or drink anything except water during the final 12 hours before the test. In addition, your health care provider may ask you to stop taking certain medicines before the test. What do the results mean? It is your responsibility to obtain your test results. Ask the lab or department performing the test when and how you will get your results. Contact your health care provider to discuss any questions you have about your results. Range of Normal Values Ranges for normal values may vary among different labs and hospitals. You should always check with your health care provider after having lab work or other tests done to discuss whether your values are considered within normal limits. Normal levels of blood glucose are as follows:  Fasting: less than 105 mg/dL.  1 hour after drinking the solution: less than 190 mg/dL.  2 hours after drinking the solution: less than 165 mg/dL.  3 hours after drinking the solution: less than 145 mg/dL.  Some substances can interfere with GTT results. These may include:  Blood pressure and heart failure medicines, including beta blockers, furosemide, and thiazides.  Anti-inflammatory medicines, including  aspirin.  Nicotine.  Some psychiatric medicines.  Meaning of Results Outside Normal Value Ranges GTT test results that are above normal values may indicate a number of health problems, such as:  Gestational diabetes.  Acute stress response.  Cushing syndrome.  Tumors such as pheochromocytoma or glucagonoma.  Long-term kidney problems.  Pancreatitis.  Hyperthyroidism.  Current infection.  Discuss your test results with your health care provider. He or she will use the results to make a diagnosis and determine a treatment plan that is right for you. This information is not intended to replace advice given to you by your health care provider. Make sure you discuss any questions you have with your health care provider. Document Released: 05/15/2012 Document Revised: 04/21/2016 Document Reviewed: 03/21/2014 Elsevier Interactive Patient Education  2018 [place] of Pregnancy The third trimester is from week 29 through week 42, months 7 through 9. This trimester is when your unborn baby (fetus) is growing very fast. At the end of the ninth month, the unborn baby is about 20 inches in length. It weighs about 6-10 pounds. Follow these instructions at home:  Avoid all smoking, herbs, and alcohol. Avoid drugs not approved by your doctor.  Do not use any tobacco products,  including cigarettes, chewing tobacco, and electronic cigarettes. If you need help quitting, ask your doctor. You may get counseling or other support to help you quit.  Only take medicine as told by your doctor. Some medicines are safe and some are not during pregnancy.  Exercise only as told by your doctor. Stop exercising if you start having cramps.  Eat regular, healthy meals.  Wear a good support bra if your breasts are tender.  Do not use hot tubs, steam rooms, or saunas.  Wear your seat belt when driving.  Avoid raw meat, uncooked cheese, and liter boxes and soil used by  cats.  Take your prenatal vitamins.  Take 1500-2000 milligrams of calcium daily starting at the 20th week of pregnancy until you deliver your baby.  Try taking medicine that helps you poop (stool softener) as needed, and if your doctor approves. Eat more fiber by eating fresh fruit, vegetables, and whole grains. Drink enough fluids to keep your pee (urine) clear or pale yellow.  Take warm water baths (sitz baths) to soothe pain or discomfort caused by hemorrhoids. Use hemorrhoid cream if your doctor approves.  If you have puffy, bulging veins (varicose veins), wear support hose. Raise (elevate) your feet for 15 minutes, 3-4 times a day. Limit salt in your diet.  Avoid heavy lifting, wear low heels, and sit up straight.  Rest with your legs raised if you have leg cramps or low back pain.  Visit your dentist if you have not gone during your pregnancy. Use a soft toothbrush to brush your teeth. Be gentle when you floss.  You can have sex (intercourse) unless your doctor tells you not to.  Do not travel far distances unless you must. Only do so with your doctor's approval.  Take prenatal classes.  Practice driving to the hospital.  Pack your hospital bag.  Prepare the baby's room.  Go to your doctor visits. Get help if:  You are not sure if you are in labor or if your water has broken.  You are dizzy.  You have mild cramps or pressure in your lower belly (abdominal).  You have a nagging pain in your belly area.  You continue to feel sick to your stomach (nauseous), throw up (vomit), or have watery poop (diarrhea).  You have bad smelling fluid coming from your vagina.  You have pain with peeing (urination). Get help right away if:  You have a fever.  You are leaking fluid from your vagina.  You are spotting or bleeding from your vagina.  You have severe belly cramping or pain.  You lose or gain weight rapidly.  You have trouble catching your breath and have chest  pain.  You notice sudden or extreme puffiness (swelling) of your face, hands, ankles, feet, or legs.  You have not felt the baby move in over an hour.  You have severe headaches that do not go away with medicine.  You have vision changes. This information is not intended to replace advice given to you by your health care provider. Make sure you discuss any questions you have with your health care provider. Document Released: 02/08/2010 Document Revised: 04/21/2016 Document Reviewed: 01/15/2013 Elsevier Interactive Patient Education  2017 [employer].

## 2018-07-24 NOTE — Progress Notes (Deleted)
   PRENATAL VISIT NOTE  Subjective:  Mallory Harris is a 27 y.o. G1P0 at [redacted]w[redacted]d being seen today for ongoing prenatal care.  She is currently monitored for the following issues for this {Blank single:19197::"high-risk","low-risk"} pregnancy and has Supervision of normal first pregnancy, antepartum and Anemia affecting pregnancy on their problem list.  Patient reports {sx:14538}.  Contractions: Irritability. Vag. Bleeding: None.  Movement: Present. Denies leaking of fluid.   The following portions of the patient's history were reviewed and updated as appropriate: allergies, current medications, past family history, past medical history, past social history, past surgical history and problem list. Problem list updated.  Objective:   Vitals:   07/24/18 0850  BP: (!) 107/58  Pulse: 84  Weight: 93 kg    Fetal Status: Fetal Heart Rate (bpm): 139   Movement: Present     General:  Alert, oriented and cooperative. Patient is in no acute distress.  Skin: Skin is warm and dry. No rash noted.   Cardiovascular: Normal heart rate noted  Respiratory: Normal respiratory effort, no problems with respiration noted  Abdomen: Soft, gravid, appropriate for gestational age.  Pain/Pressure: Absent     Pelvic: {Blank single:19197::"Cervical exam performed","Cervical exam deferred"}        Extremities: Normal range of motion.  Edema: None  Mental Status: Normal mood and affect. Normal behavior. Normal judgment and thought content.   Assessment and Plan:  Pregnancy: G1P0 at [redacted]w[redacted]d  1. Supervision of normal first pregnancy, antepartum *** - CBC - Glucose Tolerance, 2 Hours w/1 Hour - HIV antibody - RPR  {Blank single:19197::"Term","Preterm"} labor symptoms and general obstetric precautions including but not limited to vaginal bleeding, contractions, leaking of fluid and fetal movement were reviewed in detail with the patient. Please refer to After Visit Summary for other counseling recommendations.    Return in about 2 weeks (around 08/07/2018) for [facility].  Future Appointments  Date Time Provider [place]  08/07/2018  9:00 AM Seabron Spates, CNM [facility] None    Hansel Feinstein, CNM

## 2018-07-24 NOTE — Progress Notes (Signed)
   PRENATAL VISIT NOTE  Subjective:  Mallory Harris is a 27 y.o. G1P0 at [redacted]w[redacted]d being seen today for ongoing prenatal care.  She is currently monitored for the following issues for this low-risk pregnancy and has Supervision of normal first pregnancy, antepartum and Anemia affecting pregnancy on their problem list.  Patient reports occasional cramps, small weight loss despite eating, post coital bleeding 3 weeks ago.  Contractions: Irritability. Vag. Bleeding: None.  Movement: Present. Denies leaking of fluid.   The following portions of the patient's history were reviewed and updated as appropriate: allergies, current medications, past family history, past medical history, past social history, past surgical history and problem list. Problem list updated.  Objective:   Vitals:   07/24/18 0850  BP: (!) 107/58  Pulse: 84  Weight: 93 kg    Fetal Status: Fetal Heart Rate (bpm): 139   Movement: Present     General:  Alert, oriented and cooperative. Patient is in no acute distress.  Skin: Skin is warm and dry. No rash noted.   Cardiovascular: Normal heart rate noted  Respiratory: Normal respiratory effort, no problems with respiration noted  Abdomen: Soft, gravid, appropriate for gestational age.  Pain/Pressure: Absent     Pelvic: Cervical exam deferred        Extremities: Normal range of motion.  Edema: None  Mental Status: Normal mood and affect. Normal behavior. Normal judgment and thought content.   Assessment and Plan:  Pregnancy: G1P0 at [redacted]w[redacted]d  1. Supervision of normal first pregnancy, antepartum  - CBC - Glucose Tolerance, 2 Hours w/1 Hour - HIV antibody - RPR  2.    Post Coital Bleeding     Reassured can be normal but advised to seek treatment if happens again for evaluation  3.    Weight loss      Advised adding extra calories, include protein sources   Preterm labor symptoms and general obstetric precautions including but not limited to vaginal bleeding,  contractions, leaking of fluid and fetal movement were reviewed in detail with the patient. Please refer to After Visit Summary for other counseling recommendations.  Return in about 2 weeks (around 08/07/2018) for [employer].  Future Appointments  Date Time Provider [place]  08/07/2018  9:00 AM Seabron Spates, CNM [facility] None    Hansel Feinstein, CNM

## 2018-07-25 LAB — CBC
Hematocrit: 31.6 % — ABNORMAL LOW (ref 34.0–46.6)
Hemoglobin: 10.4 g/dL — ABNORMAL LOW (ref 11.1–15.9)
MCH: 27.6 pg (ref 26.6–33.0)
MCHC: 32.9 g/dL (ref 31.5–35.7)
MCV: 84 fL (ref 79–97)
PLATELETS: 166 10*3/uL (ref 150–450)
RBC: 3.77 x10E6/uL (ref 3.77–5.28)
RDW: 14.3 % (ref 12.3–15.4)
WBC: 5.4 10*3/uL (ref 3.4–10.8)

## 2018-07-25 LAB — RPR: RPR Ser Ql: NONREACTIVE

## 2018-07-25 LAB — HIV ANTIBODY (ROUTINE TESTING W REFLEX): HIV SCREEN 4TH GENERATION: NONREACTIVE

## 2018-07-25 LAB — GLUCOSE TOLERANCE, 2 HOURS W/ 1HR
GLUCOSE, 1 HOUR: 121 mg/dL (ref 65–179)
GLUCOSE, 2 HOUR: 99 mg/dL (ref 65–152)
GLUCOSE, FASTING: 72 mg/dL (ref 65–91)

## 2018-08-07 ENCOUNTER — Ambulatory Visit (INDEPENDENT_AMBULATORY_CARE_PROVIDER_SITE_OTHER): Payer: Medicaid Other | Admitting: Advanced Practice Midwife

## 2018-08-07 VITALS — BP 109/61 | HR 75 | Wt 206.4 lb

## 2018-08-07 DIAGNOSIS — O99013 Anemia complicating pregnancy, third trimester: Secondary | ICD-10-CM

## 2018-08-07 DIAGNOSIS — Z3403 Encounter for supervision of normal first pregnancy, third trimester: Secondary | ICD-10-CM

## 2018-08-07 DIAGNOSIS — Z6832 Body mass index (BMI) 32.0-32.9, adult: Secondary | ICD-10-CM

## 2018-08-07 DIAGNOSIS — O99012 Anemia complicating pregnancy, second trimester: Secondary | ICD-10-CM

## 2018-08-07 DIAGNOSIS — Z3400 Encounter for supervision of normal first pregnancy, unspecified trimester: Secondary | ICD-10-CM

## 2018-08-07 NOTE — Patient Instructions (Signed)

## 2018-08-08 ENCOUNTER — Encounter: Payer: Self-pay | Admitting: Advanced Practice Midwife

## 2018-08-08 NOTE — Progress Notes (Deleted)
   PRENATAL VISIT NOTE  Subjective:  Mallory Harris is a 27 y.o. G1P0 at [redacted]w[redacted]d being seen today for ongoing prenatal care.  She is currently monitored for the following issues for this {Blank single:19197::"high-risk","low-risk"} pregnancy and has Supervision of normal first pregnancy, antepartum and Anemia affecting pregnancy on their problem list.  Patient reports {sx:14538}.  Contractions: Not present. Vag. Bleeding: None.  Movement: Present. Denies leaking of fluid.   The following portions of the patient's history were reviewed and updated as appropriate: allergies, current medications, past family history, past medical history, past social history, past surgical history and problem list. Problem list updated.  Objective:   Vitals:   08/07/18 0851  BP: 109/61  Pulse: 75  Weight: 93.6 kg    Fetal Status: Fetal Heart Rate (bpm): 125   Movement: Present     General:  Alert, oriented and cooperative. Patient is in no acute distress.  Skin: Skin is warm and dry. No rash noted.   Cardiovascular: Normal heart rate noted  Respiratory: Normal respiratory effort, no problems with respiration noted  Abdomen: Soft, gravid, appropriate for gestational age.  Pain/Pressure: Present     Pelvic: {Blank single:19197::"Cervical exam performed","Cervical exam deferred"}        Extremities: Normal range of motion.  Edema: Trace  Mental Status: Normal mood and affect. Normal behavior. Normal judgment and thought content.   Assessment and Plan:  Pregnancy: G1P0 at [redacted]w[redacted]d  1. Supervision of normal first pregnancy, antepartum ***  2. Anemia affecting pregnancy in second trimester ***  3. BMI 32.0-32.9,adult ***  {Blank single:19197::"Term","Preterm"} labor symptoms and general obstetric precautions including but not limited to vaginal bleeding, contractions, leaking of fluid and fetal movement were reviewed in detail with the patient. Please refer to After Visit Summary for other counseling  recommendations.  Return in about 2 weeks (around 08/21/2018) for [facility].  Future Appointments  Date Time Provider [place]  08/21/2018  9:45 AM Seabron Spates, CNM [facility] None    Hansel Feinstein, CNM

## 2018-08-08 NOTE — Progress Notes (Signed)
   PRENATAL VISIT NOTE  Subjective:  Mallory Harris is a 27 y.o. G1P0 at [redacted]w[redacted]d being seen today for ongoing prenatal care.  She is currently monitored for the following issues for this low-risk pregnancy and has Supervision of normal first pregnancy, antepartum and Anemia affecting pregnancy on their problem list.  Patient reports occasional contractions.  Contractions: Not present. Vag. Bleeding: None.  Movement: Present. Denies leaking of fluid.   The following portions of the patient's history were reviewed and updated as appropriate: allergies, current medications, past family history, past medical history, past social history, past surgical history and problem list. Problem list updated.  Objective:   Vitals:   08/07/18 0851  BP: 109/61  Pulse: 75  Weight: 93.6 kg    Fetal Status: Fetal Heart Rate (bpm): 125   Movement: Present     General:  Alert, oriented and cooperative. Patient is in no acute distress.  Skin: Skin is warm and dry. No rash noted.   Cardiovascular: Normal heart rate noted  Respiratory: Normal respiratory effort, no problems with respiration noted  Abdomen: Soft, gravid, appropriate for gestational age.  Pain/Pressure: Present     Pelvic: Cervical exam deferred        Extremities: Normal range of motion.  Edema: Trace  Mental Status: Normal mood and affect. Normal behavior. Normal judgment and thought content.   Assessment and Plan:  Pregnancy: G1P0 at [redacted]w[redacted]d  1. Supervision of normal first pregnancy, antepartum      Reviewed warning signs       Glucola results reviewed, normal  2. Anemia affecting pregnancy in second trimester      Hemoglobin up to 10.4  3. BMI 32.0-32.9,adult     Gained weight this week  Preterm labor symptoms and general obstetric precautions including but not limited to vaginal bleeding, contractions, leaking of fluid and fetal movement were reviewed in detail with the patient. Please refer to After Visit Summary for other  counseling recommendations.  Return in about 2 weeks (around 08/21/2018) for [facility].  Future Appointments  Date Time Provider [place]  08/21/2018  9:45 AM Seabron Spates, CNM [facility] None    Hansel Feinstein, CNM

## 2018-08-21 ENCOUNTER — Ambulatory Visit: Payer: Medicaid Other | Admitting: Advanced Practice Midwife

## 2018-08-21 ENCOUNTER — Ambulatory Visit (INDEPENDENT_AMBULATORY_CARE_PROVIDER_SITE_OTHER): Payer: Medicaid Other | Admitting: Advanced Practice Midwife

## 2018-08-21 ENCOUNTER — Encounter: Payer: Self-pay | Admitting: Advanced Practice Midwife

## 2018-08-21 VITALS — BP 111/63 | HR 77 | Wt 205.0 lb

## 2018-08-21 DIAGNOSIS — Z3400 Encounter for supervision of normal first pregnancy, unspecified trimester: Secondary | ICD-10-CM

## 2018-08-21 DIAGNOSIS — K219 Gastro-esophageal reflux disease without esophagitis: Secondary | ICD-10-CM

## 2018-08-21 DIAGNOSIS — N949 Unspecified condition associated with female genital organs and menstrual cycle: Secondary | ICD-10-CM

## 2018-08-21 NOTE — Patient Instructions (Signed)
Third Trimester of Pregnancy The third trimester is from week 29 through week 42, months 7 through 9. This trimester is when your unborn baby (fetus) is growing very fast. At the end of the ninth month, the unborn baby is about 20 inches in length. It weighs about 6-10 pounds. Follow these instructions at home:  Avoid all smoking, herbs, and alcohol. Avoid drugs not approved by your doctor.  Do not use any tobacco products, including cigarettes, chewing tobacco, and electronic cigarettes. If you need help quitting, ask your doctor. You may get counseling or other support to help you quit.  Only take medicine as told by your doctor. Some medicines are safe and some are not during pregnancy.  Exercise only as told by your doctor. Stop exercising if you start having cramps.  Eat regular, healthy meals.  Wear a good support bra if your breasts are tender.  Do not use hot tubs, steam rooms, or saunas.  Wear your seat belt when driving.  Avoid raw meat, uncooked cheese, and liter boxes and soil used by cats.  Take your prenatal vitamins.  Take 1500-2000 milligrams of calcium daily starting at the 20th week of pregnancy until you deliver your baby.  Try taking medicine that helps you poop (stool softener) as needed, and if your doctor approves. Eat more fiber by eating fresh fruit, vegetables, and whole grains. Drink enough fluids to keep your pee (urine) clear or pale yellow.  Take warm water baths (sitz baths) to soothe pain or discomfort caused by hemorrhoids. Use hemorrhoid cream if your doctor approves.  If you have puffy, bulging veins (varicose veins), wear support hose. Raise (elevate) your feet for 15 minutes, 3-4 times a day. Limit salt in your diet.  Avoid heavy lifting, wear low heels, and sit up straight.  Rest with your legs raised if you have leg cramps or low back pain.  Visit your dentist if you have not gone during your pregnancy. Use a soft toothbrush to brush your  teeth. Be gentle when you floss.  You can have sex (intercourse) unless your doctor tells you not to.  Do not travel far distances unless you must. Only do so with your doctor's approval.  Take prenatal classes.  Practice driving to the hospital.  Pack your hospital bag.  Prepare the baby's room.  Go to your doctor visits. Get help if:  You are not sure if you are in labor or if your water has broken.  You are dizzy.  You have mild cramps or pressure in your lower belly (abdominal).  You have a nagging pain in your belly area.  You continue to feel sick to your stomach (nauseous), throw up (vomit), or have watery poop (diarrhea).  You have bad smelling fluid coming from your vagina.  You have pain with peeing (urination). Get help right away if:  You have a fever.  You are leaking fluid from your vagina.  You are spotting or bleeding from your vagina.  You have severe belly cramping or pain.  You lose or gain weight rapidly.  You have trouble catching your breath and have chest pain.  You notice sudden or extreme puffiness (swelling) of your face, hands, ankles, feet, or legs.  You have not felt the baby move in over an hour.  You have severe headaches that do not go away with medicine.  You have vision changes. This information is not intended to replace advice given to you by your health care provider. Make   sure you discuss any questions you have with your health care provider. Document Released: 02/08/2010 Document Revised: 04/21/2016 Document Reviewed: 01/15/2013 Elsevier Interactive Patient Education  2017 [place] B Streptococcus Infection During Pregnancy Group B Streptococcus (GBS) is a type of bacteria (Streptococcus agalactiae) that is often found in healthy people, commonly in the rectum, vagina, and intestines. In people who are healthy and not pregnant, the bacteria rarely cause serious illness or complications. However, women who test  positive for GBS during pregnancy can pass the bacteria to their baby during childbirth, which can cause serious infection in the baby after birth. Women with GBS may also have infections during their pregnancy or immediately after childbirth, such as such as urinary tract infections (UTIs) or infections of the uterus (uterine infections). Having GBS also increases a woman's risk of complications during pregnancy, such as early (preterm) labor or delivery, miscarriage, or stillbirth. Routine testing (screening) for GBS is recommended for all pregnant women. What increases the risk? You may have a higher risk for GBS infection during pregnancy if you had one during a past pregnancy. What are the signs or symptoms? In most cases, GBS infection does not cause symptoms in pregnant women. Signs and symptoms of a possible GBS-related infection may include:  Labor starting before the 37th week of pregnancy.  A UTI or bladder infection, which may cause: ? Fever. ? Pain or burning during urination. ? Frequent urination.  Fever during labor, along with: ? Bad-smelling discharge. ? Uterine tenderness. ? Rapid heartbeat in the mother, baby, or both.  Rare but serious symptoms of a possible GBS-related infection in women include:  Blood infection (septicemia). This may cause fever, chills, or confusion.  Lung infection (pneumonia). This may cause fever, chills, cough, rapid breathing, difficulty breathing, or chest pain.  Bone, joint, skin, or soft tissue infection.  How is this diagnosed? You may be screened for GBS between week 35 and week 37 of your pregnancy. If you have symptoms of preterm labor, you may be screened earlier. This condition is diagnosed based on lab test results from:  A swab of fluid from the vagina and rectum.  A urine sample.  How is this treated? This condition is treated with antibiotic medicine. When you go into labor, or as soon as your water breaks (your membranes  rupture), you will be given antibiotics through an IV tube. Antibiotics will continue until after you give birth. If you are having a cesarean delivery, you do not need antibiotics unless your membranes have already ruptured. Follow these instructions at home:  Take over-the-counter and prescription medicines only as told by your health care provider.  Take your antibiotic medicine as told by your health care provider. Do not stop taking the antibiotic even if you start to feel better.  Keep all pre-birth (prenatal) visits and follow-up visits as told by your health care provider. This is important. Contact a health care provider if:  You have pain or burning when you urinate.  You have to urinate frequently.  You have a fever or chills.  You develop a bad-smelling vaginal discharge. Get help right away if:  Your membranes rupture.  You go into labor.  You have severe pain in your abdomen.  You have difficulty breathing.  You have chest pain. This information is not intended to replace advice given to you by your health care provider. Make sure you discuss any questions you have with your health care provider. Document Released: 02/21/2008 Document Revised: 06/10/2016  Document Reviewed: 06/09/2016 Elsevier Interactive Patient Education  [employer].

## 2018-08-21 NOTE — Progress Notes (Signed)
   PRENATAL VISIT NOTE  Subjective:  Mallory Harris is a 27 y.o. G1P0 at [redacted]w[redacted]d being seen today for ongoing prenatal care.  She is currently monitored for the following issues for this low-risk pregnancy and has Supervision of normal first pregnancy, antepartum and Anemia affecting pregnancy on their problem list.  Patient reports some round ligament pain when turning in bed, some acid reflux.  Contractions: Not present. Vag. Bleeding: None.  Movement: Present. Denies leaking of fluid.   The following portions of the patient's history were reviewed and updated as appropriate: allergies, current medications, past family history, past medical history, past social history, past surgical history and problem list. Problem list updated.  Objective:   Vitals:   08/21/18 0944  BP: 111/63  Pulse: 77  Weight: 93 kg    Fetal Status: Fetal Heart Rate (bpm): 135   Movement: Present     General:  Alert, oriented and cooperative. Patient is in no acute distress.  Skin: Skin is warm and dry. No rash noted.   Cardiovascular: Normal heart rate noted  Respiratory: Normal respiratory effort, no problems with respiration noted  Abdomen: Soft, gravid, appropriate for gestational age.  Pain/Pressure: Present     Pelvic: Cervical exam deferred        Extremities: Normal range of motion.  Edema: Trace  Mental Status: Normal mood and affect. Normal behavior. Normal judgment and thought content.   Assessment and Plan:  Pregnancy: G1P0 at [redacted]w[redacted]d  --  GBS next visit                       Acid reflux -- may use TUMs or Zantac prn, avoid high fat foods, avoid large meals                      Round ligament pain  --  Move slowly, pillows for support  Preterm labor symptoms and general obstetric precautions including but not limited to vaginal bleeding, contractions, leaking of fluid and fetal movement were reviewed in detail with the patient. Please refer to After Visit Summary for other counseling  recommendations.  Return in about 1 week (around 08/28/2018) for [employer].  Future Appointments  Date Time Provider [place]  08/28/2018  8:30 AM [facility] NURSE [facility] None  09/04/2018  9:45 AM Seabron Spates, CNM [facility] None    Hansel Feinstein, CNM

## 2018-08-21 NOTE — Progress Notes (Deleted)
   PRENATAL VISIT NOTE  Subjective:  Mallory Harris is a 27 y.o. G1P0 at [redacted]w[redacted]d being seen today for ongoing prenatal care.  She is currently monitored for the following issues for this {Blank single:19197::"high-risk","low-risk"} pregnancy and has Supervision of normal first pregnancy, antepartum and Anemia affecting pregnancy on their problem list.  Patient reports {sx:14538}.  Contractions: Not present. Vag. Bleeding: None.  Movement: Present. Denies leaking of fluid.   The following portions of the patient's history were reviewed and updated as appropriate: allergies, current medications, past family history, past medical history, past social history, past surgical history and problem list. Problem list updated.  Objective:   Vitals:   08/21/18 0944  BP: 111/63  Pulse: 77  Weight: 93 kg    Fetal Status: Fetal Heart Rate (bpm): 135   Movement: Present     General:  Alert, oriented and cooperative. Patient is in no acute distress.  Skin: Skin is warm and dry. No rash noted.   Cardiovascular: Normal heart rate noted  Respiratory: Normal respiratory effort, no problems with respiration noted  Abdomen: Soft, gravid, appropriate for gestational age.  Pain/Pressure: Present     Pelvic: {Blank single:19197::"Cervical exam performed","Cervical exam deferred"}        Extremities: Normal range of motion.  Edema: Trace  Mental Status: Normal mood and affect. Normal behavior. Normal judgment and thought content.   Assessment and Plan:  Pregnancy: G1P0 at [redacted]w[redacted]d  There are no diagnoses linked to this encounter. {Blank single:19197::"Term","Preterm"} labor symptoms and general obstetric precautions including but not limited to vaginal bleeding, contractions, leaking of fluid and fetal movement were reviewed in detail with the patient. Please refer to After Visit Summary for other counseling recommendations.  Return in about 1 week (around 08/28/2018) for [employer].  Future  Appointments  Date Time Provider [place]  08/28/2018  8:30 AM [facility] NURSE [facility] None  09/04/2018  9:45 AM Seabron Spates, CNM [facility] None    Hansel Feinstein, CNM

## 2018-08-28 ENCOUNTER — Encounter: Payer: Self-pay | Admitting: Advanced Practice Midwife

## 2018-08-28 ENCOUNTER — Other Ambulatory Visit (HOSPITAL_COMMUNITY)
Admission: RE | Admit: 2018-08-28 | Discharge: 2018-08-28 | Disposition: A | Discharge status: Home / Self Care | Payer: Medicaid Other | Source: Ambulatory Visit | Attending: Advanced Practice Midwife | Admitting: Advanced Practice Midwife

## 2018-08-28 ENCOUNTER — Ambulatory Visit (INDEPENDENT_AMBULATORY_CARE_PROVIDER_SITE_OTHER): Payer: Medicaid Other | Admitting: Advanced Practice Midwife

## 2018-08-28 VITALS — BP 111/75 | HR 83 | Wt 210.0 lb

## 2018-08-28 DIAGNOSIS — Z3400 Encounter for supervision of normal first pregnancy, unspecified trimester: Secondary | ICD-10-CM

## 2018-08-28 DIAGNOSIS — Z3403 Encounter for supervision of normal first pregnancy, third trimester: Secondary | ICD-10-CM | POA: Insufficient documentation

## 2018-08-28 NOTE — Progress Notes (Signed)
   PRENATAL VISIT NOTE  Subjective:  Mallory Harris is a 27 y.o. G1P0 at [redacted]w[redacted]d being seen today for ongoing prenatal care.  She is currently monitored for the following issues for this low-risk pregnancy and has Supervision of normal first pregnancy, antepartum and Anemia affecting pregnancy on their problem list.  Patient reports occasional contractions.  Contractions: Not present. Vag. Bleeding: None.  Movement: Present. Denies leaking of fluid.   The following portions of the patient's history were reviewed and updated as appropriate: allergies, current medications, past family history, past medical history, past social history, past surgical history and problem list. Problem list updated.  Objective:   Vitals:   08/28/18 0832  BP: 111/75  Pulse: 83  Weight: 95.3 kg    Fetal Status: Fetal Heart Rate (bpm): 137 Fundal Height: 36 cm Movement: Present  Presentation: Vertex  General:  Alert, oriented and cooperative. Patient is in no acute distress.  Skin: Skin is warm and dry. No rash noted.   Cardiovascular: Normal heart rate noted  Respiratory: Normal respiratory effort, no problems with respiration noted  Abdomen: Soft, gravid, appropriate for gestational age.  Pain/Pressure: Present     Pelvic: Cervical exam performed Dilation: 2 Effacement (%): 80 Station: -3  Extremities: Normal range of motion.  Edema: Trace  Mental Status: Normal mood and affect. Normal behavior. Normal judgment and thought content.   Assessment and Plan:  Pregnancy: G1P0 at [redacted]w[redacted]d  1. Supervision of normal first pregnancy, antepartum      Reviewed signs of labor - Culture, beta strep (group b only) - GC/Chlamydia probe amp ([place])not at [facility]  Term labor symptoms and general obstetric precautions including but not limited to vaginal bleeding, contractions, leaking of fluid and fetal movement were reviewed in detail with the patient. Please refer to After Visit Summary for other counseling  recommendations.  Return in about 1 week (around 09/04/2018) for [employer].  Future Appointments  Date Time Provider [place]  09/04/2018  9:45 AM Seabron Spates, CNM [facility] None    Hansel Feinstein, CNM

## 2018-08-28 NOTE — Patient Instructions (Signed)
Vaginal Delivery Vaginal delivery means that you will give birth by pushing your baby out of your birth canal (vagina). A team of health care providers will help you before, during, and after vaginal delivery. Birth experiences are unique for every woman and every pregnancy, and birth experiences vary depending on where you choose to give birth. What should I do to prepare for my baby's birth? Before your baby is born, it is important to talk with your health care provider about:  Your labor and delivery preferences. These may include: ? Medicines that you may be given. ? How you will manage your pain. This might include non-medical pain relief techniques or injectable pain relief such as epidural analgesia. ? How you and your baby will be monitored during labor and delivery. ? Who may be in the labor and delivery room with you. ? Your feelings about surgical delivery of your baby (cesarean delivery, or C-section) if this becomes necessary. ? Your feelings about receiving donated blood through an IV tube (blood transfusion) if this becomes necessary.  Whether you are able: ? To take pictures or videos of the birth. ? To eat during labor and delivery. ? To move around, walk, or change positions during labor and delivery.  What to expect after your baby is born, such as: ? Whether delayed umbilical cord clamping and cutting is offered. ? Who will care for your baby right after birth. ? Medicines or tests that may be recommended for your baby. ? Whether breastfeeding is supported in your hospital or birth center. ? How long you will be in the hospital or birth center.  How any medical conditions you have may affect your baby or your labor and delivery experience.  To prepare for your baby's birth, you should also:  Attend all of your health care visits before delivery (prenatal visits) as recommended by your health care provider. This is important.  Prepare your home for your baby's  arrival. Make sure that you have: ? Diapers. ? Baby clothing. ? Feeding equipment. ? Safe sleeping arrangements for you and your baby.  Install a car seat in your vehicle. Have your car seat checked by a certified car seat installer to make sure that it is installed safely.  Think about who will help you with your new baby at home for at least the first several weeks after delivery.  What can I expect when I arrive at the birth center or hospital? Once you are in labor and have been admitted into the hospital or birth center, your health care provider may:  Review your pregnancy history and any concerns you have.  Insert an IV tube into one of your veins. This is used to give you fluids and medicines.  Check your blood pressure, pulse, temperature, and heart rate (vital signs).  Check whether your bag of water (amniotic sac) has broken (ruptured).  Talk with you about your birth plan and discuss pain control options.  Monitoring Your health care provider may monitor your contractions (uterine monitoring) and your baby's heart rate (fetal monitoring). You may need to be monitored:  Often, but not continuously (intermittently).  All the time or for long periods at a time (continuously). Continuous monitoring may be needed if: ? You are taking certain medicines, such as medicine to relieve pain or make your contractions stronger. ? You have pregnancy or labor complications.  Monitoring may be done by:  Placing a special stethoscope or a handheld monitoring device on your abdomen to   check your baby's heartbeat, and feeling your abdomen for contractions. This method of monitoring does not continuously record your baby's heartbeat or your contractions.  Placing monitors on your abdomen (external monitors) to record your baby's heartbeat and the frequency and length of contractions. You may not have to wear external monitors all the time.  Placing monitors inside of your uterus  (internal monitors) to record your baby's heartbeat and the frequency, length, and strength of your contractions. ? Your health care provider may use internal monitors if he or she needs more information about the strength of your contractions or your baby's heart rate. ? Internal monitors are put in place by passing a thin, flexible wire through your vagina and into your uterus. Depending on the type of monitor, it may remain in your uterus or on your baby's head until birth. ? Your health care provider will discuss the benefits and risks of internal monitoring with you and will ask for your permission before inserting the monitors.  Telemetry. This is a type of continuous monitoring that can be done with external or internal monitors. Instead of having to stay in bed, you are able to move around during telemetry. Ask your health care provider if telemetry is an option for you.  Physical exam Your health care provider may perform a physical exam. This may include:  Checking whether your baby is positioned: ? With the head toward your vagina (head-down). This is most common. ? With the head toward the top of your uterus (head-up or breech). If your baby is in a breech position, your health care provider may try to turn your baby to a head-down position so you can deliver vaginally. If it does not seem that your baby can be born vaginally, your provider may recommend surgery to deliver your baby. In rare cases, you may be able to deliver vaginally if your baby is head-up (breech delivery). ? Lying sideways (transverse). Babies that are lying sideways cannot be delivered vaginally.  Checking your cervix to determine: ? Whether it is thinning out (effacing). ? Whether it is opening up (dilating). ? How low your baby has moved into your birth canal.  What are the three stages of labor and delivery?  Normal labor and delivery is divided into the following three stages: Stage 1  Stage 1 is the  longest stage of labor, and it can last for hours or days. Stage 1 includes: ? Early labor. This is when contractions may be irregular, or regular and mild. Generally, early labor contractions are more than 10 minutes apart. ? Active labor. This is when contractions get longer, more regular, more frequent, and more intense. ? The transition phase. This is when contractions happen very close together, are very intense, and may last longer than during any other part of labor.  Contractions generally feel mild, infrequent, and irregular at first. They get stronger, more frequent (about every 2-3 minutes), and more regular as you progress from early labor through active labor and transition.  Many women progress through stage 1 naturally, but you may need help to continue making progress. If this happens, your health care provider may talk with you about: ? Rupturing your amniotic sac if it has not ruptured yet. ? Giving you medicine to help make your contractions stronger and more frequent.  Stage 1 ends when your cervix is completely dilated to 4 inches (10 cm) and completely effaced. This happens at the end of the transition phase. Stage 2  Once   your cervix is completely effaced and dilated to 4 inches (10 cm), you may start to feel an urge to push. It is common for the body to naturally take a rest before feeling the urge to push, especially if you received an epidural or certain other pain medicines. This rest period may last for up to 1-2 hours, depending on your unique labor experience.  During stage 2, contractions are generally less painful, because pushing helps relieve contraction pain. Instead of contraction pain, you may feel stretching and burning pain, especially when the widest part of your baby's head passes through the vaginal opening (crowning).  Your health care provider will closely monitor your pushing progress and your baby's progress through the vagina during stage 2.  Your  health care provider may massage the area of skin between your vaginal opening and anus (perineum) or apply warm compresses to your perineum. This helps it stretch as the baby's head starts to crown, which can help prevent perineal tearing. ? In some cases, an incision may be made in your perineum (episiotomy) to allow the baby to pass through the vaginal opening. An episiotomy helps to make the opening of the vagina larger to allow more room for the baby to fit through.  It is very important to breathe and focus so your health care provider can control the delivery of your baby's head. Your health care provider may have you decrease the intensity of your pushing, to help prevent perineal tearing.  After delivery of your baby's head, the shoulders and the rest of the body generally deliver very quickly and without difficulty.  Once your baby is delivered, the umbilical cord may be cut right away, or this may be delayed for 1-2 minutes, depending on your baby's health. This may vary among health care providers, hospitals, and birth centers.  If you and your baby are healthy enough, your baby may be placed on your chest or abdomen to help maintain the baby's temperature and to help you bond with each other. Some mothers and babies start breastfeeding at this time. Your health care team will dry your baby and help keep your baby warm during this time.  Your baby may need immediate care if he or she: ? Showed signs of distress during labor. ? Has a medical condition. ? Was born too early (prematurely). ? Had a bowel movement before birth (meconium). ? Shows signs of difficulty transitioning from being inside the uterus to being outside of the uterus. If you are planning to breastfeed, your health care team will help you begin a feeding. Stage 3  The third stage of labor starts immediately after the birth of your baby and ends after you deliver the placenta. The placenta is an organ that develops  during pregnancy to provide oxygen and nutrients to your baby in the womb.  Delivering the placenta may require some pushing, and you may have mild contractions. Breastfeeding can stimulate contractions to help you deliver the placenta.  After the placenta is delivered, your uterus should tighten (contract) and become firm. This helps to stop bleeding in your uterus. To help your uterus contract and to control bleeding, your health care provider may: ? Give you medicine by injection, through an IV tube, by mouth, or through your rectum (rectally). ? Massage your abdomen or perform a vaginal exam to remove any blood clots that are left in your uterus. ? Empty your bladder by placing a thin, flexible tube (catheter) into your bladder. ? Encourage   you to breastfeed your baby. After labor is over, you and your baby will be monitored closely to ensure that you are both healthy until you are ready to go home. Your health care team will teach you how to care for yourself and your baby. This information is not intended to replace advice given to you by your health care provider. Make sure you discuss any questions you have with your health care provider. Document Released: 08/23/2008 Document Revised: 06/03/2016 Document Reviewed: 11/29/2015 Elsevier Interactive Patient Education  2018 [employer] Inc[employer]  

## 2018-08-29 LAB — CERVICOVAGINAL ANCILLARY ONLY
CHLAMYDIA, DNA PROBE: NEGATIVE
Neisseria Gonorrhea: NEGATIVE

## 2018-09-01 LAB — CULTURE, BETA STREP (GROUP B ONLY): Strep Gp B Culture: NEGATIVE

## 2018-09-04 ENCOUNTER — Encounter: Payer: Self-pay | Admitting: Advanced Practice Midwife

## 2018-09-04 ENCOUNTER — Ambulatory Visit (INDEPENDENT_AMBULATORY_CARE_PROVIDER_SITE_OTHER): Payer: Medicaid Other | Admitting: Advanced Practice Midwife

## 2018-09-04 VITALS — BP 108/59 | HR 66 | Wt 211.0 lb

## 2018-09-04 DIAGNOSIS — O99012 Anemia complicating pregnancy, second trimester: Secondary | ICD-10-CM

## 2018-09-04 DIAGNOSIS — O99013 Anemia complicating pregnancy, third trimester: Secondary | ICD-10-CM

## 2018-09-04 DIAGNOSIS — Z3400 Encounter for supervision of normal first pregnancy, unspecified trimester: Secondary | ICD-10-CM

## 2018-09-04 DIAGNOSIS — Z3403 Encounter for supervision of normal first pregnancy, third trimester: Secondary | ICD-10-CM

## 2018-09-04 DIAGNOSIS — D649 Anemia, unspecified: Secondary | ICD-10-CM

## 2018-09-04 NOTE — Progress Notes (Signed)
   PRENATAL VISIT NOTE  Subjective:  Mallory Harris is a 27 y.o. G1P0 at [redacted]w[redacted]d being seen today for ongoing prenatal care.  She is currently monitored for the following issues for this low-risk pregnancy and has Supervision of normal first pregnancy, antepartum and Anemia affecting pregnancy on their problem list.  Patient reports occasional contractions.  Contractions: Irregular. Vag. Bleeding: None.  Movement: Present. Denies leaking of fluid.   The following portions of the patient's history were reviewed and updated as appropriate: allergies, current medications, past family history, past medical history, past social history, past surgical history and problem list. Problem list updated.  Objective:   Vitals:   09/04/18 0935  BP: (!) 108/59  Pulse: 66  Weight: 95.7 kg    Fetal Status: Fetal Heart Rate (bpm): 152 Fundal Height: 36 cm Movement: Present     General:  Alert, oriented and cooperative. Patient is in no acute distress.  Skin: Skin is warm and dry. No rash noted.   Cardiovascular: Normal heart rate noted  Respiratory: Normal respiratory effort, no problems with respiration noted  Abdomen: Soft, gravid, appropriate for gestational age.  Pain/Pressure: Present     Pelvic: Cervical exam deferred        Extremities: Normal range of motion.  Edema: Trace  Mental Status: Normal mood and affect. Normal behavior. Normal judgment and thought content.   Assessment and Plan:  Pregnancy: G1P0 at [redacted]w[redacted]d  1. Anemia affecting pregnancy in second trimester     Stable 2.   Pregnancy      Reviewed signs of labor  Term labor symptoms and general obstetric precautions including but not limited to vaginal bleeding, contractions, leaking of fluid and fetal movement were reviewed in detail with the patient. Please refer to After Visit Summary for other counseling recommendations.  Return in about 1 week (around 09/11/2018) for [employer].  Future Appointments  Date Time  Provider [place]  09/11/2018  9:30 AM Tamala Julian, [place], CNM [facility] None    Hansel Feinstein, CNM

## 2018-09-04 NOTE — Patient Instructions (Signed)
Vaginal Delivery Vaginal delivery means that you will give birth by pushing your baby out of your birth canal (vagina). A team of health care providers will help you before, during, and after vaginal delivery. Birth experiences are unique for every woman and every pregnancy, and birth experiences vary depending on where you choose to give birth. What should I do to prepare for my baby's birth? Before your baby is born, it is important to talk with your health care provider about:  Your labor and delivery preferences. These may include: ? Medicines that you may be given. ? How you will manage your pain. This might include non-medical pain relief techniques or injectable pain relief such as epidural analgesia. ? How you and your baby will be monitored during labor and delivery. ? Who may be in the labor and delivery room with you. ? Your feelings about surgical delivery of your baby (cesarean delivery, or C-section) if this becomes necessary. ? Your feelings about receiving donated blood through an IV tube (blood transfusion) if this becomes necessary.  Whether you are able: ? To take pictures or videos of the birth. ? To eat during labor and delivery. ? To move around, walk, or change positions during labor and delivery.  What to expect after your baby is born, such as: ? Whether delayed umbilical cord clamping and cutting is offered. ? Who will care for your baby right after birth. ? Medicines or tests that may be recommended for your baby. ? Whether breastfeeding is supported in your hospital or birth center. ? How long you will be in the hospital or birth center.  How any medical conditions you have may affect your baby or your labor and delivery experience.  To prepare for your baby's birth, you should also:  Attend all of your health care visits before delivery (prenatal visits) as recommended by your health care provider. This is important.  Prepare your home for your baby's  arrival. Make sure that you have: ? Diapers. ? Baby clothing. ? Feeding equipment. ? Safe sleeping arrangements for you and your baby.  Install a car seat in your vehicle. Have your car seat checked by a certified car seat installer to make sure that it is installed safely.  Think about who will help you with your new baby at home for at least the first several weeks after delivery.  What can I expect when I arrive at the birth center or hospital? Once you are in labor and have been admitted into the hospital or birth center, your health care provider may:  Review your pregnancy history and any concerns you have.  Insert an IV tube into one of your veins. This is used to give you fluids and medicines.  Check your blood pressure, pulse, temperature, and heart rate (vital signs).  Check whether your bag of water (amniotic sac) has broken (ruptured).  Talk with you about your birth plan and discuss pain control options.  Monitoring Your health care provider may monitor your contractions (uterine monitoring) and your baby's heart rate (fetal monitoring). You may need to be monitored:  Often, but not continuously (intermittently).  All the time or for long periods at a time (continuously). Continuous monitoring may be needed if: ? You are taking certain medicines, such as medicine to relieve pain or make your contractions stronger. ? You have pregnancy or labor complications.  Monitoring may be done by:  Placing a special stethoscope or a handheld monitoring device on your abdomen to   check your baby's heartbeat, and feeling your abdomen for contractions. This method of monitoring does not continuously record your baby's heartbeat or your contractions.  Placing monitors on your abdomen (external monitors) to record your baby's heartbeat and the frequency and length of contractions. You may not have to wear external monitors all the time.  Placing monitors inside of your uterus  (internal monitors) to record your baby's heartbeat and the frequency, length, and strength of your contractions. ? Your health care provider may use internal monitors if he or she needs more information about the strength of your contractions or your baby's heart rate. ? Internal monitors are put in place by passing a thin, flexible wire through your vagina and into your uterus. Depending on the type of monitor, it may remain in your uterus or on your baby's head until birth. ? Your health care provider will discuss the benefits and risks of internal monitoring with you and will ask for your permission before inserting the monitors.  Telemetry. This is a type of continuous monitoring that can be done with external or internal monitors. Instead of having to stay in bed, you are able to move around during telemetry. Ask your health care provider if telemetry is an option for you.  Physical exam Your health care provider may perform a physical exam. This may include:  Checking whether your baby is positioned: ? With the head toward your vagina (head-down). This is most common. ? With the head toward the top of your uterus (head-up or breech). If your baby is in a breech position, your health care provider may try to turn your baby to a head-down position so you can deliver vaginally. If it does not seem that your baby can be born vaginally, your provider may recommend surgery to deliver your baby. In rare cases, you may be able to deliver vaginally if your baby is head-up (breech delivery). ? Lying sideways (transverse). Babies that are lying sideways cannot be delivered vaginally.  Checking your cervix to determine: ? Whether it is thinning out (effacing). ? Whether it is opening up (dilating). ? How low your baby has moved into your birth canal.  What are the three stages of labor and delivery?  Normal labor and delivery is divided into the following three stages: Stage 1  Stage 1 is the  longest stage of labor, and it can last for hours or days. Stage 1 includes: ? Early labor. This is when contractions may be irregular, or regular and mild. Generally, early labor contractions are more than 10 minutes apart. ? Active labor. This is when contractions get longer, more regular, more frequent, and more intense. ? The transition phase. This is when contractions happen very close together, are very intense, and may last longer than during any other part of labor.  Contractions generally feel mild, infrequent, and irregular at first. They get stronger, more frequent (about every 2-3 minutes), and more regular as you progress from early labor through active labor and transition.  Many women progress through stage 1 naturally, but you may need help to continue making progress. If this happens, your health care provider may talk with you about: ? Rupturing your amniotic sac if it has not ruptured yet. ? Giving you medicine to help make your contractions stronger and more frequent.  Stage 1 ends when your cervix is completely dilated to 4 inches (10 cm) and completely effaced. This happens at the end of the transition phase. Stage 2  Once   your cervix is completely effaced and dilated to 4 inches (10 cm), you may start to feel an urge to push. It is common for the body to naturally take a rest before feeling the urge to push, especially if you received an epidural or certain other pain medicines. This rest period may last for up to 1-2 hours, depending on your unique labor experience.  During stage 2, contractions are generally less painful, because pushing helps relieve contraction pain. Instead of contraction pain, you may feel stretching and burning pain, especially when the widest part of your baby's head passes through the vaginal opening (crowning).  Your health care provider will closely monitor your pushing progress and your baby's progress through the vagina during stage 2.  Your  health care provider may massage the area of skin between your vaginal opening and anus (perineum) or apply warm compresses to your perineum. This helps it stretch as the baby's head starts to crown, which can help prevent perineal tearing. ? In some cases, an incision may be made in your perineum (episiotomy) to allow the baby to pass through the vaginal opening. An episiotomy helps to make the opening of the vagina larger to allow more room for the baby to fit through.  It is very important to breathe and focus so your health care provider can control the delivery of your baby's head. Your health care provider may have you decrease the intensity of your pushing, to help prevent perineal tearing.  After delivery of your baby's head, the shoulders and the rest of the body generally deliver very quickly and without difficulty.  Once your baby is delivered, the umbilical cord may be cut right away, or this may be delayed for 1-2 minutes, depending on your baby's health. This may vary among health care providers, hospitals, and birth centers.  If you and your baby are healthy enough, your baby may be placed on your chest or abdomen to help maintain the baby's temperature and to help you bond with each other. Some mothers and babies start breastfeeding at this time. Your health care team will dry your baby and help keep your baby warm during this time.  Your baby may need immediate care if he or she: ? Showed signs of distress during labor. ? Has a medical condition. ? Was born too early (prematurely). ? Had a bowel movement before birth (meconium). ? Shows signs of difficulty transitioning from being inside the uterus to being outside of the uterus. If you are planning to breastfeed, your health care team will help you begin a feeding. Stage 3  The third stage of labor starts immediately after the birth of your baby and ends after you deliver the placenta. The placenta is an organ that develops  during pregnancy to provide oxygen and nutrients to your baby in the womb.  Delivering the placenta may require some pushing, and you may have mild contractions. Breastfeeding can stimulate contractions to help you deliver the placenta.  After the placenta is delivered, your uterus should tighten (contract) and become firm. This helps to stop bleeding in your uterus. To help your uterus contract and to control bleeding, your health care provider may: ? Give you medicine by injection, through an IV tube, by mouth, or through your rectum (rectally). ? Massage your abdomen or perform a vaginal exam to remove any blood clots that are left in your uterus. ? Empty your bladder by placing a thin, flexible tube (catheter) into your bladder. ? Encourage   you to breastfeed your baby. After labor is over, you and your baby will be monitored closely to ensure that you are both healthy until you are ready to go home. Your health care team will teach you how to care for yourself and your baby. This information is not intended to replace advice given to you by your health care provider. Make sure you discuss any questions you have with your health care provider. Document Released: 08/23/2008 Document Revised: 06/03/2016 Document Reviewed: 11/29/2015 Elsevier Interactive Patient Education  2018 [employer] Inc[employer]  

## 2018-09-10 ENCOUNTER — Inpatient Hospital Stay (HOSPITAL_COMMUNITY)
Admission: EM | Admit: 2018-09-10 | Discharge: 2018-09-12 | DRG: 807 | Disposition: A | Discharge status: Home / Self Care | Payer: Medicaid Other | Attending: Obstetrics and Gynecology | Admitting: Obstetrics and Gynecology

## 2018-09-10 ENCOUNTER — Encounter (HOSPITAL_COMMUNITY): Payer: Self-pay

## 2018-09-10 DIAGNOSIS — Z3483 Encounter for supervision of other normal pregnancy, third trimester: Secondary | ICD-10-CM | POA: Diagnosis present

## 2018-09-10 DIAGNOSIS — D649 Anemia, unspecified: Secondary | ICD-10-CM | POA: Diagnosis present

## 2018-09-10 DIAGNOSIS — O9902 Anemia complicating childbirth: Principal | ICD-10-CM | POA: Diagnosis present

## 2018-09-10 DIAGNOSIS — O135 Gestational [pregnancy-induced] hypertension without significant proteinuria, complicating the puerperium: Secondary | ICD-10-CM | POA: Diagnosis present

## 2018-09-10 DIAGNOSIS — Z3A38 38 weeks gestation of pregnancy: Secondary | ICD-10-CM | POA: Diagnosis not present

## 2018-09-10 DIAGNOSIS — O479 False labor, unspecified: Secondary | ICD-10-CM | POA: Diagnosis present

## 2018-09-10 LAB — TYPE AND SCREEN
ABO/RH(D): A POS
Antibody Screen: NEGATIVE

## 2018-09-10 MED ORDER — SENNOSIDES-DOCUSATE SODIUM 8.6-50 MG PO TABS
2.0000 | ORAL_TABLET | ORAL | Status: DC
  Administered 2018-09-11: 2 via ORAL
  Filled 2018-09-10 (×2): qty 2

## 2018-09-10 MED ORDER — OXYTOCIN 10 UNIT/ML IJ SOLN
1.0000 m[IU]/min | INTRAMUSCULAR | Status: DC
  Administered 2018-09-10: 10 m[IU]/min via INTRAVENOUS
  Filled 2018-09-10: qty 3

## 2018-09-10 MED ORDER — ZOLPIDEM TARTRATE 5 MG PO TABS: 5.0000 mg | ORAL_TABLET | Freq: Every evening | ORAL | Status: DC | PRN

## 2018-09-10 MED ORDER — OXYTOCIN 40 UNITS IN LACTATED RINGERS INFUSION - SIMPLE MED
1.0000 m[IU]/min | INTRAVENOUS | Status: DC
Start: 2018-09-10 — End: 2018-09-10
  Filled 2018-09-10: qty 1000

## 2018-09-10 MED ORDER — DIPHENHYDRAMINE HCL 25 MG PO CAPS: 25.0000 mg | ORAL_CAPSULE | Freq: Four times a day (QID) | ORAL | Status: DC | PRN

## 2018-09-10 MED ORDER — PRENATAL MULTIVITAMIN CH
1.0000 | ORAL_TABLET | Freq: Every day | ORAL | Status: DC
  Administered 2018-09-11 – 2018-09-12 (×2): 1 via ORAL
  Filled 2018-09-10 (×2): qty 1

## 2018-09-10 MED ORDER — BENZOCAINE-MENTHOL 20-0.5 % EX AERO
1.0000 "application " | INHALATION_SPRAY | CUTANEOUS | Status: DC | PRN
  Administered 2018-09-11: 1 via TOPICAL
  Filled 2018-09-10: qty 56

## 2018-09-10 MED ORDER — COCONUT OIL OIL: 1.0000 "application " | TOPICAL_OIL | Status: DC | PRN

## 2018-09-10 MED ORDER — ERYTHROMYCIN 5 MG/GM OP OINT: TOPICAL_OINTMENT | Freq: Once | OPHTHALMIC | Status: DC

## 2018-09-10 MED ORDER — ACETAMINOPHEN 325 MG PO TABS
650.0000 mg | ORAL_TABLET | ORAL | Status: DC | PRN
  Administered 2018-09-11: 650 mg via ORAL
  Filled 2018-09-10: qty 2

## 2018-09-10 MED ORDER — SIMETHICONE 80 MG PO CHEW: 80.0000 mg | CHEWABLE_TABLET | ORAL | Status: DC | PRN

## 2018-09-10 MED ORDER — LIDOCAINE HCL 1 % IJ SOLN
INTRAMUSCULAR | Status: AC
  Filled 2018-09-10: qty 20

## 2018-09-10 MED ORDER — TETANUS-DIPHTH-ACELL PERTUSSIS 5-2.5-18.5 LF-MCG/0.5 IM SUSP: 0.5000 mL | Freq: Once | INTRAMUSCULAR | Status: DC

## 2018-09-10 MED ORDER — IBUPROFEN 600 MG PO TABS
600.0000 mg | ORAL_TABLET | Freq: Four times a day (QID) | ORAL | Status: DC
  Administered 2018-09-11 – 2018-09-12 (×7): 600 mg via ORAL
  Filled 2018-09-10 (×7): qty 1

## 2018-09-10 MED ORDER — DIBUCAINE 1 % RE OINT: 1.0000 "application " | TOPICAL_OINTMENT | RECTAL | Status: DC | PRN

## 2018-09-10 MED ORDER — WITCH HAZEL-GLYCERIN EX PADS: 1.0000 "application " | MEDICATED_PAD | CUTANEOUS | Status: DC | PRN

## 2018-09-10 MED ORDER — ONDANSETRON HCL 4 MG/2ML IJ SOLN: 4.0000 mg | INTRAMUSCULAR | Status: DC | PRN

## 2018-09-10 MED ORDER — ONDANSETRON HCL 4 MG PO TABS: 4.0000 mg | ORAL_TABLET | ORAL | Status: DC | PRN

## 2018-09-10 NOTE — ED Notes (Addendum)
OB rapid response team/ midwife arrived.

## 2018-09-10 NOTE — ED Notes (Signed)
Baby delivered at 1907.

## 2018-09-10 NOTE — ED Triage Notes (Signed)
Per report, the patient was upstairs visiting her husband and started to have abdominal pain, pt was escorted to the ED where she was found to be in labor

## 2018-09-10 NOTE — ED Notes (Signed)
Provider stated pt is fully effaced.

## 2018-09-10 NOTE — ED Notes (Addendum)
Pharmacy called for 40/ 1,000 mL of pitocin in LR.

## 2018-09-10 NOTE — ED Notes (Signed)
FHR-180

## 2018-09-10 NOTE — ED Notes (Signed)
[facility] called for transport 

## 2018-09-10 NOTE — Progress Notes (Signed)
RROB called at 1807 to assess a visitor who had began to labor and was transferred to ED.  RROB arrived to [place] ED at [phone] and found pt in active labor.  ED MD reports pt was 7cm last exam.  Upon RROB sve pt was found to be 9/100/0.  Dr Ilda Basset called and notified about pt.  MD to send M.Williams, cnm and Dr Elly Modena over to attend delivery.  Viable female infant was delivered at 75 by Jimmye Norman, cnm.  Apgars 8/9

## 2018-09-10 NOTE — ED Notes (Signed)
950ml/hr for 554ml then 62.5 ml/hr of pitocin.

## 2018-09-10 NOTE — ED Notes (Signed)
Patient pushing

## 2018-09-10 NOTE — ED Notes (Signed)
OB nurse said patient is at 10 cm

## 2018-09-10 NOTE — ED Notes (Signed)
OB rapid nurse at bedside. Fetal HR monitor at bedside. Baby warmer at bedside and on.

## 2018-09-10 NOTE — ED Provider Notes (Signed)
Emergency Department Provider Note   I have reviewed the triage vital signs and the nursing notes.   HISTORY  Chief Complaint Laboring   HPI Mallory Harris is a 27 y.o. female G1P0 at 8 weeks presents to the emergency department with contraction-type pain.  The patient is here with her child's father who has appendicitis and is awaiting surgery. She passed her mucous plug and had a rush of fluid. She presents to the ED for evaluation with severe intermittent abdominal pain. No significant complications. Patient followed by OB at [facility] with plans to deliver at [facility]. No radiation or symptoms or modifying factors.    Past Medical History:  Diagnosis Date  . Medical history non-contributory     Patient Active Problem List   Diagnosis Date Noted  . Uterine contractions during pregnancy 09/10/2018  . Anemia affecting pregnancy 04/19/2018  . Supervision of normal first pregnancy, antepartum 04/17/2018    Past Surgical History:  Procedure Laterality Date  . NO PAST SURGERIES      Allergies Patient has no known allergies.  Family History  Problem Relation Age of Onset  . Diabetes Maternal Grandmother     Social History Social History   Tobacco Use  . Smoking status: Never Smoker  . Smokeless tobacco: Never Used  Substance Use Topics  . Alcohol use: No  . Drug use: No    Review of Systems  Constitutional: No fever/chills Eyes: No visual changes. ENT: No sore throat. Cardiovascular: Denies chest pain. Respiratory: Denies shortness of breath. Gastrointestinal: Positive abdominal pain.  No nausea, no vomiting.  No diarrhea.  No constipation. Genitourinary: Negative for dysuria. Positive rush of fluid.  Musculoskeletal: Negative for back pain. Skin: Negative for rash. Neurological: Negative for headaches, focal weakness or numbness.  10-point ROS otherwise negative.  ____________________________________________   PHYSICAL EXAM:  VITAL  SIGNS: ED Triage Vitals [09/10/18 1816]  Enc Vitals Group     BP 128/68     Pulse Rate (!) 105     Resp 20     Temp 97.9 F (36.6 C)     Temp Source Oral     SpO2 100 %   Constitutional: Alert and oriented. Appears uncomfortable with frequent contractions.  Eyes: Conjunctivae are normal.  Head: Atraumatic. Nose: No congestion/rhinnorhea. Mouth/Throat: Mucous membranes are moist.  Neck: No stridor.  Cardiovascular: Normal rate, regular rhythm. Good peripheral circulation. Grossly normal heart sounds.   Respiratory: Normal respiratory effort.  No retractions. Lungs CTAB. Gastrointestinal: Soft gravid abdomen.  Genitourinary: Bloody show with fluid on sterile bimanual exam. Cervix is fully effaced and 9 cm dilated. Fetus head down.  Musculoskeletal: No lower extremity tenderness nor edema. No gross deformities of extremities. Neurologic:  Normal speech and language. No gross focal neurologic deficits are appreciated.  Skin:  Skin is warm, dry and intact. No rash noted.  ____________________________________________   LABS (all labs ordered are listed, but only abnormal results are displayed)  Labs Reviewed  CBC - Abnormal; Notable for the following components:      Result Value   WBC 19.0 (*)    RBC 3.44 (*)    Hemoglobin 9.9 (*)    HCT 29.9 (*)    Platelets 139 (*)    All other components within normal limits  RPR  TYPE AND SCREEN  ABO/RH  TYPE AND SCREEN  ABO/RH   ____________________________________________  RADIOLOGY  None ____________________________________________   PROCEDURES  Procedure(s) performed:   Procedures  None ____________________________________________   INITIAL IMPRESSION /  ASSESSMENT AND PLAN / ED COURSE  Pertinent labs & imaging results that were available during my care of the patient were reviewed by me and considered in my medical decision making (see chart for details).  Patient arrives to the ED in advanced labor. Appears  uncomfortable and progressing rapidly. Monitor and Toco applied with Category I tracing. Rapid OB and Midwife arrived to [facility] for imminent delivery. Patient has an uncomplicated NSVD in the ED. Baby boy with good Apgars on delivery. Mom with some lacerations which were repaired by midwife at bedside. No significant hemorrhage.   Patient admitted to the [facility] service for post-partum care and monitoring.  ____________________________________________  FINAL CLINICAL IMPRESSION(S) / ED DIAGNOSES  Final diagnoses:  NSVD (normal spontaneous vaginal delivery)     MEDICATIONS GIVEN DURING THIS VISIT:  Medications  oxytocin (PITOCIN) 40 Units in lactated ringers 1,000 mL (0.04 Units/mL) infusion (0 milli-units/min Intravenous Stopped 09/11/18 0538)  lidocaine (XYLOCAINE) 1 % (with pres) injection (  Canceled Entry 09/11/18 0020)  ibuprofen (ADVIL,MOTRIN) tablet 600 mg (600 mg Oral Given 09/11/18 0604)  acetaminophen (TYLENOL) tablet 650 mg (650 mg Oral Given 09/11/18 0026)  zolpidem (AMBIEN) tablet 5 mg (has no administration in time range)  diphenhydrAMINE (BENADRYL) capsule 25 mg (has no administration in time range)  senna-docusate (Senokot-S) tablet 2 tablet (2 tablets Oral Given 09/11/18 0017)  simethicone (MYLICON) chewable tablet 80 mg (has no administration in time range)  ondansetron (ZOFRAN) tablet 4 mg (has no administration in time range)    Or  ondansetron (ZOFRAN) injection 4 mg (has no administration in time range)  prenatal multivitamin tablet 1 tablet (has no administration in time range)  witch hazel-glycerin (TUCKS) pad 1 application (has no administration in time range)    And  dibucaine (NUPERCAINAL) 1 % rectal ointment 1 application (has no administration in time range)  benzocaine-Menthol (DERMOPLAST) 20-0.5 % topical spray 1 application (1 application Topical Given 09/11/18 0026)  coconut oil (has no administration in time range)  Tdap (BOOSTRIX) injection 0.5 mL (has no  administration in time range)  Influenza vac split quadrivalent PF (FLUARIX) injection 0.5 mL (has no administration in time range)  amLODipine (NORVASC) tablet 10 mg (has no administration in time range)    Note:  This document was prepared using Dragon voice recognition software and may include unintentional dictation errors.  Nanda Quinton, MD Emergency Medicine    Keyauna Graefe, [facility], MD 09/11/18 [phone]

## 2018-09-10 NOTE — ED Notes (Signed)
Called to imminent delivery at [facility] Emergency Department  When I arrived, Dr Elly Modena also arrived  Dr Laverta Baltimore from ED was also present. Preparations had already been made for delivery.   OB Rapid Response nurse had patient on the EFM and Fetal Heart Rate was reassuring UCs were coming every 2-3 min.  Cervix was 10cm/100%effaced/+2station/vertex  Pushing was initiated  Progressed well over time to SVD @ 59  Female infant delivered vertex, OA position No difficulty with shoulders  Placenta delivered spontaneously and grossly intact with 3 vessel cord  There were bilateral labial lacerations, shallow.  There was a small 1st degree perineal midline laceration I repaired the right labial and perineal lac's with 3-0 VIcryl under local anesthetic with 1% lidocaine  Mother and baby stable and transported via 81 to [place], [place], [place] .

## 2018-09-10 NOTE — H&P (Signed)
LABOR AND DELIVERY ADMISSION HISTORY AND PHYSICAL NOTE  Davyn Tartt is a 27 y.o. female [id number] with IUP at [redacted]w[redacted]d by LMP presenting to mother-baby after SVD at [facility] on 10/14.   Prenatal History/Complications: PNC at [facility] established at 17 weeks  Pregnancy complications:  - anemia, on Fe supplements   Past Medical History: Past Medical History:  Diagnosis Date  . Medical history non-contributory     Past Surgical History: Past Surgical History:  Procedure Laterality Date  . NO PAST SURGERIES      Obstetrical History: OB History    Gravida  1   Para  1   Term  1   Preterm      AB      Living  1     SAB      TAB      Ectopic      Multiple  0   Live Births  1           Social History: Social History   Socioeconomic History  . Marital status: Single    Spouse name: Not on file  . Number of children: Not on file  . Years of education: Not on file  . Highest education level: Not on file  Occupational History  . Not on file  Social Needs  . Financial resource strain: Not on file  . Food insecurity:    Worry: Not on file    Inability: Not on file  . Transportation needs:    Medical: Not on file    Non-medical: Not on file  Tobacco Use  . Smoking status: Never Smoker  . Smokeless tobacco: Never Used  Substance and Sexual Activity  . Alcohol use: No  . Drug use: No  . Sexual activity: Yes    Birth control/protection: None  Lifestyle  . Physical activity:    Days per week: Not on file    Minutes per session: Not on file  . Stress: Not on file  Relationships  . Social connections:    Talks on phone: Not on file    Gets together: Not on file    Attends religious service: Not on file    Active member of club or organization: Not on file    Attends meetings of clubs or organizations: Not on file    Relationship status: Not on file  Other Topics Concern  . Not on file  Social History Narrative  . Not on file     Family History: Family History  Problem Relation Age of Onset  . Diabetes Maternal Grandmother     Allergies: No Known Allergies  Medications Prior to Admission  Medication Sig Dispense Refill Last Dose  . ferrous sulfate (FERROUSUL) 325 (65 FE) MG tablet Take 1 tablet (325 mg total) by mouth 2 (two) times daily. 60 tablet 1 Taking  . Prenatal Multivit-Min-Fe-FA (PRENATAL VITAMINS) 0.8 MG tablet Take 1 tablet by mouth daily. 30 tablet 12 Taking     Review of Systems  All systems reviewed and negative except as stated in HPI  Physical Exam Blood pressure 134/86, pulse 98, temperature 99.1 F (37.3 C), temperature source Oral, resp. rate 20, last menstrual period 12/14/2017, SpO2 98 %, unknown if currently breastfeeding. General appearance: alert, oriented, NAD Lungs: normal respiratory effort Heart: regular rate Abdomen: soft, non-tender; gravid, FH appropriate for GA Extremities: No calf swelling or tenderness Presentation: cephalic Dilation: 10 Effacement (%): 100 Station: Plus 1 Exam by:: Juliette Alcide  Prenatal  labs: ABO, Rh: A/Positive/-- (05/21 1105) Antibody: Negative (05/21 1105) Rubella: 1.73 (05/21 1105) RPR: Non Reactive (08/27 0925)  HBsAg: Negative (05/21 1105)  HIV: Non Reactive (08/27 0925)  GC/Chlamydia: negative  GBS:   negative  2-hr GTT: normal  Genetic screening:  Low risk NIPs  Anatomy US: normal with limited views of spine, f/u anatomy scan normal and complete   Prenatal Transfer Tool  Maternal Diabetes: No Genetic Screening: Normal Maternal Ultrasounds/Referrals: Normal Fetal Ultrasounds or other Referrals:  None Maternal Substance Abuse:  No Significant Maternal Medications:  None Significant Maternal Lab Results: Lab values include: Group B Strep negative  No results found for this or any previous visit (from the past 24 hour(s)).  Patient Active Problem List   Diagnosis Date Noted  . Uterine contractions during pregnancy 09/10/2018   . Anemia affecting pregnancy 04/19/2018  . Supervision of normal first pregnancy, antepartum 04/17/2018    Assessment: Xareni Glade is a 27 y.o. G1P1001 at [redacted]w[redacted]d here for postpartum care after SVD at [facility].   #Labor: Delivered  #ID:  GBS neg  #MOF: Breast  #MOC:IUD  #Circ:  Outpatient   Melina Schools 09/10/2018, 9:44 PM

## 2018-09-11 ENCOUNTER — Encounter: Payer: Medicaid Other | Admitting: Advanced Practice Midwife

## 2018-09-11 LAB — RPR: RPR Ser Ql: NONREACTIVE

## 2018-09-11 LAB — CBC
HEMATOCRIT: 29.9 % — AB (ref 36.0–46.0)
HEMOGLOBIN: 9.9 g/dL — AB (ref 12.0–15.0)
MCH: 28.8 pg (ref 26.0–34.0)
MCHC: 33.1 g/dL (ref 30.0–36.0)
MCV: 86.9 fL (ref 80.0–100.0)
Platelets: 139 10*3/uL — ABNORMAL LOW (ref 150–400)
RBC: 3.44 MIL/uL — ABNORMAL LOW (ref 3.87–5.11)
RDW: 14.8 % (ref 11.5–15.5)
WBC: 19 10*3/uL — AB (ref 4.0–10.5)
nRBC: 0 % (ref 0.0–0.2)

## 2018-09-11 LAB — ABO/RH
ABO/RH(D): A POS
ABO/RH(D): A POS

## 2018-09-11 LAB — TYPE AND SCREEN
ABO/RH(D): A POS
ANTIBODY SCREEN: NEGATIVE

## 2018-09-11 MED ORDER — INFLUENZA VAC SPLIT QUAD 0.5 ML IM SUSY
0.5000 mL | PREFILLED_SYRINGE | INTRAMUSCULAR | Status: AC | PRN
  Administered 2018-09-12: 0.5 mL via INTRAMUSCULAR

## 2018-09-11 MED ORDER — AMLODIPINE BESYLATE 10 MG PO TABS
10.0000 mg | ORAL_TABLET | Freq: Every day | ORAL | Status: DC
  Administered 2018-09-11 – 2018-09-12 (×2): 10 mg via ORAL
  Filled 2018-09-11 (×3): qty 1

## 2018-09-11 NOTE — Progress Notes (Signed)
POSTPARTUM PROGRESS NOTE  Post Partum Day 1  Subjective:  Mallory Harris is a 27 y.o. G1P1001 s/p SVD at [place] at [redacted]w[redacted]d.  She reports she is doing well. No acute events overnight. She denies any problems with ambulating, voiding or po intake. Denies nausea or vomiting.  Pain is well controlled.  Lochia is mild.  Objective: Blood pressure 140/70, pulse 67, temperature 97.9 F (36.6 C), temperature source Oral, resp. rate 18, last menstrual period 12/14/2017, SpO2 100 %, unknown if currently breastfeeding.  Physical Exam:  General: alert, cooperative and no distress Chest: no respiratory distress Heart:regular rate  Abdomen: soft, nontender,  Uterine Fundus: firm, appropriately tender DVT Evaluation: No calf swelling or tenderness Extremities: no edema Skin: warm, dry  Recent Labs    09/11/18 0003  HGB 9.9*  HCT 29.9*    Assessment/Plan: [name] is a 27 y.o. G1P1001 s/p SVD at [facility] at [redacted]w[redacted]d   PPD#1 - Doing well  Routine postpartum care BP elevated. Will start Norvasc 10mg  and order Baby Love 48hr BP check  Contraception: IUD Feeding: breast  Dispo: Plan for discharge PPD#2.   LOS: 1 day   Caroline More, DO PGY-2 09/11/2018, 8:43 AM

## 2018-09-11 NOTE — Lactation Note (Signed)
This note was copied from a baby's chart. Lactation Consultation Note  Patient Name: Boy Dameka Younker LAGTX'M Date: 09/11/2018 Reason for consult: Initial assessment  Baby is 64 hours old / ET / delivered at [place] [place] [facility].  Mom mentioned the Father of the Randel Books is a patient at [facility] due to Appendicitis. Per mom couldn't make it to [place] [facility] to deliver.  @ this consult baby awake, and fussy, LC changed a wet diaper.  LC offered to assist to latch and mom receptive. She ha been wearing shells  And that has helped to elongate the nipple areola complex.  LC assisted to latch,depth obtained for a short time 5-6 good strong sucks , a 1-2 swallows  And baby released. Spoon fed 1.5 ml.  Baby STS with mom after attempt.  LC reviewed breast feeding basics and potential feeding behaviors with an Early term infant.  And recommended for extra stimulation for the [facility] to set up a DEBP for post pumping.  Continue hand expressing , pre- pump if needed, and latch with firm support.  Football position with good support probably will work best until the baby sustains latch  Due to the size of moms breast.  LC spoke with Waynard Edwards, [facility] and she is going off and will have the 3-7p RN set up the DEBP.  Mother informed of post-discharge support and given phone number to the lactation department, including services for phone call assistance; out-patient appointments; and breastfeeding support group. List of other breastfeeding resources in the community given in the handout. Encouraged mother to call for problems or concerns related to breastfeeding.  LC encouraged to call for latch assist/ and if needing help spoon feeding.  Baby has voided , HNS yet.     Maternal Data Has patient been taught Hand Expression?: Yes Does the patient have breastfeeding experience prior to this delivery?: No  Feeding Feeding Type: Breast Fed  LATCH Score Latch: Repeated attempts needed to sustain latch, nipple  held in mouth throughout feeding, stimulation needed to elicit sucking reflex.  Audible Swallowing: A few with stimulation  Type of Nipple: Everted at rest and after stimulation  Comfort (Breast/Nipple): Soft / non-tender  Hold (Positioning): Assistance needed to correctly position infant at breast and maintain latch.  LATCH Score: 7  Interventions Interventions: Breast feeding basics reviewed;Assisted with latch;Skin to skin;Breast massage;Hand express;Breast compression;Adjust position;Support pillows;Position options  Lactation Tools Discussed/Used Tools: ([place]  asked Brutus [facility]x RN  set up the DEBP / or next RN) Shell Type: Inverted Breast pump type: Manual   Consult Status Consult Status: Follow-up Date: 09/11/18 Follow-up type: In-patient    [place] 09/11/2018, 2:58 PM

## 2018-09-11 NOTE — Progress Notes (Signed)
Parent request formula to supplement breast feeding due to mother's fatigue and sore nipples. She does not wish to pump at this time. Parents have been informed of small tummy size of newborn, taught hand expression and understand the possible consequences of formula to the health of the infant. The possible consequences shared with patient include 1) Loss of confidence in breastfeeding 2) Engorgement 3) Allergic sensitization of baby(asthma/allergies) and 4) decreased milk supply for mother.After discussion of the above the mother decided to  supplement with formula.  The tool used to give formula supplement will be bottle with slow flow nipple given by dad.   Mother counseled to avoid artificial nipples because this practice may lead to latch difficulties,inadequate milk transfer and nipple soreness.

## 2018-09-12 MED ORDER — AMLODIPINE BESYLATE 10 MG PO TABS: 10.0000 mg | ORAL_TABLET | Freq: Every day | ORAL | 1 refills | Status: DC

## 2018-09-12 MED ORDER — IBUPROFEN 600 MG PO TABS: 600.0000 mg | ORAL_TABLET | Freq: Four times a day (QID) | ORAL | 1 refills | Status: DC

## 2018-09-12 MED ORDER — SENNOSIDES-DOCUSATE SODIUM 8.6-50 MG PO TABS: 2.0000 | ORAL_TABLET | ORAL | 0 refills | Status: DC

## 2018-09-12 NOTE — Lactation Note (Signed)
This note was copied from a baby's chart. Lactation Consultation Note: Mom reports she is having trouble getting the baby to latch on. Has been giving bottles of formula. Reports she pumped once but did not get any Colostrum. Is able to hand express and see a few drops. Baby awake and fussy after diaper change. Offered assist with latch and mom agreeable. Baby took a few attempts.  Mom reports this is the best he has done. Nursed for about 10 min then fussy. Mom wants to give formula. Reviewed BF basics. Encouraged to always nurse first then give formula if baby is still fussy. Reviewed our phone number, OP appointments and BFSG as resources for support after DC. To call prn  Patient Name: Mallory Harris RZNBV'A Date: 09/12/2018 Reason for consult: Follow-up assessment   Maternal Data Formula Feeding for Exclusion: No Has patient been taught Hand Expression?: Yes Does the patient have breastfeeding experience prior to this delivery?: No  Feeding Feeding Type: Breast Fed  LATCH Score Latch: Repeated attempts needed to sustain latch, nipple held in mouth throughout feeding, stimulation needed to elicit sucking reflex.  Audible Swallowing: A few with stimulation  Type of Nipple: Everted at rest and after stimulation  Comfort (Breast/Nipple): Soft / non-tender  Hold (Positioning): Assistance needed to correctly position infant at breast and maintain latch.  LATCH Score: 7  Interventions Interventions: Breast feeding basics reviewed;Support pillows;Position options;Hand express  Lactation Tools Discussed/Used WIC Program: Yes   Consult Status Consult Status: Complete    Truddie Crumble 09/12/2018, 11:09 AM

## 2018-09-12 NOTE — Discharge Summary (Signed)
OB Discharge Summary     Patient Name: Mallory Harris DOB: 1991-01-08 MRN: [id number]  Date of admission: 09/10/2018 Delivering MD: Seabron Spates   Date of discharge: 09/12/2018  Admitting diagnosis: labor  Intrauterine pregnancy: [redacted]w[redacted]d     Secondary diagnosis:  Active Problems:   Uterine contractions during pregnancy  Additional problems: Gestational HTN PP      Discharge diagnosis: Term Pregnancy Delivered and Gestational Hypertension                                                                                                Post partum procedures:None  Augmentation: None  Complications: None  Hospital course:  Onset of Labor With Vaginal Delivery     27 y.o. yo G1P1001 at [redacted]w[redacted]d was admitted in Active Labor on 09/10/2018. Patient had an uncomplicated labor course as follows:  Membrane Rupture Time/Date: 6:18 PM ,09/10/2018   Intrapartum Procedures: Episiotomy: None [1]                                         Lacerations:  1st degree [2];Perineal [11]  Patient had a delivery of a Viable infant. 09/10/2018  Information for the patient's newborn:  Kemya, Shed [[id number]]  Delivery Method: Vaginal, Spontaneous(Filed from Delivery Summary)  Patient delivered in [place] ED due to presentation with advanced cervical dilation. She was transferred to [facility] for [facility] care. After delivery noted to have mild range elevated BPs. She was started on Norvasc 10 mg. BPs stable at time of discharge. Baby love BP check was ordered and patient was instructed to make 1 week follow up at [facility] for BP check.   Pateint had an uncomplicated postpartum course.  She is ambulating, tolerating a regular diet, passing flatus, and urinating well. Patient is discharged home in stable condition on 09/12/18.   Physical exam  Vitals:   09/11/18 1000 09/11/18 1426 09/11/18 2100 09/12/18 0547  BP: 127/76 121/78 120/78 118/81  Pulse: 88 80 90 80  Resp: 20 18 18 16   Temp: 98.3  F (36.8 C) 98.2 F (36.8 C) 98 F (36.7 C) 97.7 F (36.5 C)  TempSrc: Oral Oral Oral Oral  SpO2: 100% 100%  100%   General: alert and cooperative Lochia: appropriate Uterine Fundus: firm Incision: N/A DVT Evaluation: No evidence of DVT seen on physical exam. Labs: Lab Results  Component Value Date   WBC 19.0 (H) 09/11/2018   HGB 9.9 (L) 09/11/2018   HCT 29.9 (L) 09/11/2018   MCV 86.9 09/11/2018   PLT 139 (L) 09/11/2018   No flowsheet data found.  Discharge instruction: per After Visit Summary and "Baby and Me Booklet".  After visit meds:  Allergies as of 09/12/2018   No Known Allergies     Medication List    TAKE these medications   amLODipine 10 MG tablet Commonly known as:  NORVASC Take 1 tablet (10 mg total) by mouth daily. Start taking on:  09/13/2018   ferrous sulfate 325 (65 FE) MG  tablet Take 1 tablet (325 mg total) by mouth 2 (two) times daily.   ibuprofen 600 MG tablet Commonly known as:  ADVIL,MOTRIN Take 1 tablet (600 mg total) by mouth every 6 (six) hours.   Prenatal Vitamins 0.8 MG tablet Take 1 tablet by mouth daily.   senna-docusate 8.6-50 MG tablet Commonly known as:  Senokot-S Take 2 tablets by mouth daily.       Diet: routine diet  Activity: Advance as tolerated. Pelvic rest for 6 weeks.   Outpatient follow up:4 weeks Follow up Appt:No future appointments. Follow up Visit:No follow-ups on file.  Postpartum contraception: IUD Mirena  Newborn Data: Live born female  Birth Weight: 6 lb 11.4 oz (3045 g) APGAR: 8, 9  Newborn Delivery   Birth date/time:  09/10/2018 19:07:00 Delivery type:  Vaginal, Spontaneous     Baby Feeding: Breast Disposition:home with mother   09/12/2018 Melina Schools, DO

## 2018-09-12 NOTE — Lactation Note (Signed)
This note was copied from a baby's chart. Lactation Consultation Note  Patient Name: Mallory Harris TKPTW'S Date: 09/12/2018 Reason for consult: Follow-up assessment;Early term 37-38.6wks;1st time breastfeeding   P1, 34 hr female infant, ETI,  weight loss -2% Mom feeding choice at admission was BF and currently she is breast and bottle feeding. Per mom,  Infant not been latching to breast. Per mom, she will start pumping every 3 hours, mom understands how to use DEBP was shown by nurse, knows how to dissemble  and reassemble parts.  Mom wanted to  be shown how to hand express again, [place] worked with mom on hand expression and she expressed 3 ml of colostrum to be given to infant at next feeding. [place] asked mom if she want to latch infant to breast, infant latched on left breast and suckled for about 3 mins,. Mom was pleased that infant was latching to breast now and mom  explained to [facility]  infant just had 18  ml of formula prior to  [facility] entering room.  Mom's plan: 1. Mom will ask nurse or LC to help  assist with latching infant to breast at next feeding.  2. Mom will pump and give infant back expressed breast milk. 3. Mom plans to supplement with formula after BF 15 mins. and giving EBM.      Maternal Data Formula Feeding for Exclusion: No Has patient been taught Hand Expression?: Yes(Mom hand expressed 58ml of colostrum will give at next feeding,)  Feeding Feeding Type: Bottle Fed - Formula  LATCH Score Latch: Grasps breast easily, tongue down, lips flanged, rhythmical sucking.  Audible Swallowing: Spontaneous and intermittent  Type of Nipple: Flat  Comfort (Breast/Nipple): Soft / non-tender  Hold (Positioning): Assistance needed to correctly position infant at breast and maintain latch.  LATCH Score: 8  Interventions Interventions: Assisted with latch;Hand express;Breast compression;Shells  Lactation Tools Discussed/Used     Consult Status Consult Status:  Follow-up Date: 09/12/18 Follow-up type: In-patient    Vicente Serene 09/12/2018, 5:45 AM

## 2018-09-12 NOTE — Discharge Instructions (Signed)
Postpartum Care After Vaginal Delivery °The period of time right after you deliver your newborn is called the postpartum period. °What kind of medical care will I receive? °· You may continue to receive fluids and medicines through an IV tube inserted into one of your veins. °· If an incision was made near your vagina (episiotomy) or if you had some vaginal tearing during delivery, cold compresses may be placed on your episiotomy or your tear. This helps to reduce pain and swelling. °· You may be given a squirt bottle to use when you go to the bathroom. You may use this until you are comfortable wiping as usual. To use the squirt bottle, follow these steps: °? Before you urinate, fill the squirt bottle with warm water. Do not use hot water. °? After you urinate, while you are sitting on the toilet, use the squirt bottle to rinse the area around your urethra and vaginal opening. This rinses away any urine and blood. °? You may do this instead of wiping. As you start healing, you may use the squirt bottle before wiping yourself. Make sure to wipe gently. °? Fill the squirt bottle with clean water every time you use the bathroom. °· You will be given sanitary pads to wear. °How can I expect to feel? °· You may not feel the need to urinate for several hours after delivery. °· You will have some soreness and pain in your abdomen and vagina. °· If you are breastfeeding, you may have uterine contractions every time you breastfeed for up to several weeks postpartum. Uterine contractions help your uterus return to its normal size. °· It is normal to have vaginal bleeding (lochia) after delivery. The amount and appearance of lochia is often similar to a menstrual period in the first week after delivery. It will gradually decrease over the next few weeks to a dry, yellow-brown discharge. For most women, lochia stops completely by 6-8 weeks after delivery. Vaginal bleeding can vary from woman to woman. °· Within the first few  days after delivery, you may have breast engorgement. This is when your breasts feel heavy, full, and uncomfortable. Your breasts may also throb and feel hard, tightly stretched, warm, and tender. After this occurs, you may have milk leaking from your breasts. Your health care provider can help you relieve discomfort due to breast engorgement. Breast engorgement should go away within a few days. °· You may feel more sad or worried than normal due to hormonal changes after delivery. These feelings should not last more than a few days. If these feelings do not go away after several days, speak with your health care provider. °How should I care for myself? °· Tell your health care provider if you have pain or discomfort. °· Drink enough water to keep your urine clear or pale yellow. °· Wash your hands thoroughly with soap and water for at least 20 seconds after changing your sanitary pads, after using the toilet, and before holding or feeding your baby. °· If you are not breastfeeding, avoid touching your breasts a lot. Doing this can make your breasts produce more milk. °· If you become weak or lightheaded, or you feel like you might faint, ask for help before: °? Getting out of bed. °? Showering. °· Change your sanitary pads frequently. Watch for any changes in your flow, such as a sudden increase in volume, a change in color, the passing of large blood clots. If you pass a blood clot from your vagina, save it   to show to your health care provider. Do not flush blood clots down the toilet without having your health care provider look at them. °· Make sure that all your vaccinations are up to date. This can help protect you and your baby from getting certain diseases. You may need to have immunizations done before you leave the hospital. °· If desired, talk with your health care provider about methods of family planning or birth control (contraception). °How can I start bonding with my baby? °Spending as much time as  possible with your baby is very important. During this time, you and your baby can get to know each other and develop a bond. Having your baby stay with you in your room (rooming in) can give you time to get to know your baby. Rooming in can also help you become comfortable caring for your baby. Breastfeeding can also help you bond with your baby. °How can I plan for returning home with my baby? °· Make sure that you have a car seat installed in your vehicle. °? Your car seat should be checked by a certified car seat installer to make sure that it is installed safely. °? Make sure that your baby fits into the car seat safely. °· Ask your health care provider any questions you have about caring for yourself or your baby. Make sure that you are able to contact your health care provider with any questions after leaving the hospital. °This information is not intended to replace advice given to you by your health care provider. Make sure you discuss any questions you have with your health care provider. °Document Released: 09/11/2007 Document Revised: 04/18/2016 Document Reviewed: 10/19/2015 °Elsevier Interactive Patient Education © 2018 [employer] ° °

## 2018-09-12 NOTE — Lactation Note (Deleted)
This note was copied from a baby's chart. Lactation Consultation Note  Patient Name: Boy Christiane Sistare JWLKH'V Date: 09/12/2018 Reason for consult: Follow-up assessment;Early term 37-38.6wks  P1, 33 hour female infant, weight loss -2%, Per parents,  infant had 5 soiled and 8 wet diapers. LC entered room infant started cuing to feed. Infant had  one soiled and  one wet diaper when LC was  in room with parents. Mom latched infant to right breast using foot ball hold,  infant mouth was  wide and swallowing observed by LC, Mom BF for 10 minutes and still BF as LC left room.  Maternal Data    Feeding Feeding Type: Breast Fed  LATCH Score Latch: Grasps breast easily, tongue down, lips flanged, rhythmical sucking.  Audible Swallowing: Spontaneous and intermittent  Type of Nipple: Everted at rest and after stimulation  Comfort (Breast/Nipple): Soft / non-tender  Hold (Positioning): Assistance needed to correctly position infant at breast and maintain latch.  LATCH Score: 9  Interventions    Lactation Tools Discussed/Used     Consult Status Consult Status: Follow-up Date: 09/13/18 Follow-up type: In-patient    Vicente Serene 09/12/2018, 4:37 AM

## 2019-09-10 ENCOUNTER — Encounter: Payer: Self-pay | Admitting: Advanced Practice Midwife

## 2019-10-17 ENCOUNTER — Encounter: Payer: Self-pay | Admitting: Obstetrics & Gynecology

## 2019-10-17 ENCOUNTER — Other Ambulatory Visit: Payer: Self-pay

## 2019-10-17 ENCOUNTER — Ambulatory Visit (INDEPENDENT_AMBULATORY_CARE_PROVIDER_SITE_OTHER): Payer: Self-pay | Admitting: Obstetrics & Gynecology

## 2019-10-17 DIAGNOSIS — Z113 Encounter for screening for infections with a predominantly sexual mode of transmission: Secondary | ICD-10-CM

## 2019-10-17 DIAGNOSIS — Z3480 Encounter for supervision of other normal pregnancy, unspecified trimester: Secondary | ICD-10-CM | POA: Insufficient documentation

## 2019-10-17 DIAGNOSIS — Z3482 Encounter for supervision of other normal pregnancy, second trimester: Secondary | ICD-10-CM

## 2019-10-17 DIAGNOSIS — Z3A15 15 weeks gestation of pregnancy: Secondary | ICD-10-CM

## 2019-10-17 NOTE — Patient Instructions (Signed)

## 2019-10-17 NOTE — Progress Notes (Signed)
  Subjective:    Mallory Harris is being seen today for her first obstetrical visit G1P0 This is not a planned pregnancy. She is at [redacted]w[redacted]d gestation. Her obstetrical history is significant for short pregnancy interval. Relationship with FOB: significant other, living together. Patient does intend to breast feed. Attempted on at infant without success with latching. Pregnancy history fully reviewed.  Patient reports occ feeling cold. Not taknig PNV.  Review of Systems:   Review of Systems  Objective:     BP 125/71   Pulse 89   Wt 223 lb (101.2 kg)   LMP 06/30/2019   BMI 40.79 kg/m  Physical Exam  Exam    Assessment:    Pregnancy: G2P1001 Patient Active Problem List   Diagnosis Date Noted  . Supervision of other normal pregnancy, antepartum 10/17/2019  . Uterine contractions during pregnancy 09/10/2018  . Anemia affecting pregnancy 04/19/2018  Short cervix on exam- no h/o prior PTL/PTD     Plan:     Initial labs drawn. Prenatal vitamins. Problem list reviewed and updated. AFP3 discussed: requested. Role of ultrasound in pregnancy discussed; fetal survey: requested. Amniocentesis discussed: not indicated. Follow up in 4 weeks. 60% of 30 min visit spent on counseling and coordination of care.  [place] to check cervical length.   Mallory Harris 10/17/2019

## 2019-10-17 NOTE — Progress Notes (Signed)
DATING AND VIABILITY SONOGRAM   Mallory Harris is a 28 y.o. year old G2P1001 with LMP Patient's last menstrual period was 06/30/2019 (exact date). which would correlate to  [redacted]w[redacted]d weeks gestation.  She has regular menstrual cycles.   She is here today for a confirmatory initial sonogram.    GESTATION: SINGLETON  FETAL ACTIVITY:          Heart rate        154 bpm          The fetus is active.     GESTATIONAL AGE AND  BIOMETRICS:  Gestational criteria: Estimated Date of Delivery: 04/05/20 by LMP now at [redacted]w[redacted]d  Previous Scans:0  FEMUR LENGTH        1.67   cm         14-6 weeks                                                                               AVERAGE EGA(BY THIS SCAN):  14-6 weeks  WORKING EDD( LMP ):  04-05-2020     TECHNICIAN COMMENTS:  Patient informed that the ultrasound is considered a limited obstetric ultrasound and is not intended to be a complete ultrasound exam. Patient also informed that the ultrasound is not being completed with the intent of assessing for fetal or placental anomalies or any pelvic abnormalities. Explained that the purpose of today's ultrasound is to assess for fetal heart rate. Patient acknowledges the purpose of the exam and the limitations of the study.    Kathrene Alu 10/17/2019 3:18 PM

## 2019-10-21 LAB — URINE CULTURE, OB REFLEX

## 2019-10-21 LAB — GC/CHLAMYDIA PROBE AMP (~~LOC~~) NOT AT ARMC
Chlamydia: NEGATIVE
Comment: NEGATIVE
Comment: NORMAL
Neisseria Gonorrhea: NEGATIVE

## 2019-10-21 LAB — CULTURE, OB URINE

## 2019-10-23 ENCOUNTER — Other Ambulatory Visit: Payer: Self-pay

## 2019-10-23 DIAGNOSIS — Z3480 Encounter for supervision of other normal pregnancy, unspecified trimester: Secondary | ICD-10-CM

## 2019-10-23 MED ORDER — AMBULATORY NON FORMULARY MEDICATION: 1.0000 | 0 refills | Status: DC

## 2019-10-28 ENCOUNTER — Other Ambulatory Visit: Payer: Self-pay

## 2019-11-12 ENCOUNTER — Encounter: Payer: Self-pay | Admitting: Advanced Practice Midwife

## 2019-11-12 ENCOUNTER — Ambulatory Visit (INDEPENDENT_AMBULATORY_CARE_PROVIDER_SITE_OTHER): Payer: Self-pay | Admitting: Advanced Practice Midwife

## 2019-11-12 ENCOUNTER — Other Ambulatory Visit: Payer: Self-pay

## 2019-11-12 ENCOUNTER — Ambulatory Visit (HOSPITAL_COMMUNITY)
Admission: RE | Admit: 2019-11-12 | Discharge: 2019-11-12 | Disposition: A | Discharge status: Home / Self Care | Payer: Medicaid Other | Source: Ambulatory Visit | Attending: Obstetrics & Gynecology | Admitting: Obstetrics & Gynecology

## 2019-11-12 VITALS — BP 99/49 | HR 84 | Wt 218.0 lb

## 2019-11-12 DIAGNOSIS — Z029 Encounter for administrative examinations, unspecified: Secondary | ICD-10-CM

## 2019-11-12 DIAGNOSIS — Z3480 Encounter for supervision of other normal pregnancy, unspecified trimester: Secondary | ICD-10-CM

## 2019-11-12 DIAGNOSIS — O99012 Anemia complicating pregnancy, second trimester: Secondary | ICD-10-CM

## 2019-11-12 DIAGNOSIS — Z8759 Personal history of other complications of pregnancy, childbirth and the puerperium: Secondary | ICD-10-CM | POA: Insufficient documentation

## 2019-11-12 DIAGNOSIS — Z363 Encounter for antenatal screening for malformations: Secondary | ICD-10-CM

## 2019-11-12 DIAGNOSIS — Z3482 Encounter for supervision of other normal pregnancy, second trimester: Secondary | ICD-10-CM

## 2019-11-12 DIAGNOSIS — Z3A19 19 weeks gestation of pregnancy: Secondary | ICD-10-CM

## 2019-11-12 NOTE — Patient Instructions (Signed)

## 2019-11-12 NOTE — Progress Notes (Signed)
   PRENATAL VISIT NOTE  Subjective:  Mallory Harris is a 28 y.o. G2P1001 at [redacted]w[redacted]d being seen today for ongoing prenatal care.  She is currently monitored for the following issues for this low-risk pregnancy and has Anemia affecting pregnancy; Uterine contractions during pregnancy; and Supervision of other normal pregnancy, antepartum on their problem list.      Last baby delivered by me at [facility] ED  Patient reports no complaints.  Contractions: Not present. Vag. Bleeding: None.  Movement: Present. Denies leaking of fluid.   The following portions of the patient's history were reviewed and updated as appropriate: allergies, current medications, past family history, past medical history, past social history, past surgical history and problem list.   Objective:   Vitals:   11/12/19 1022  BP: (!) 99/49  Pulse: 84  Weight: 98.9 kg    Fetal Status: Fetal Heart Rate (bpm): 147   Movement: Present     General:  Alert, oriented and cooperative. Patient is in no acute distress.  Skin: Skin is warm and dry. No rash noted.   Cardiovascular: Normal heart rate noted  Respiratory: Normal respiratory effort, no problems with respiration noted  Abdomen: Soft, gravid, appropriate for gestational age.  Pain/Pressure: Present     Pelvic: Cervical exam deferred        Extremities: Normal range of motion.  Edema: None  Mental Status: Normal mood and affect. Normal behavior. Normal judgment and thought content.   Assessment and Plan:  Pregnancy: G2P1001 at [redacted]w[redacted]d Patient Active Problem List   Diagnosis Date Noted  . History of precipitous delivery 11/12/2019  . Supervision of other normal pregnancy, antepartum 10/17/2019  . Uterine contractions during pregnancy 09/10/2018  . Anemia affecting pregnancy 04/19/2018   .Reviewed delivery last year Discussed location of new hospital and MAU availability  Preterm labor symptoms and general obstetric precautions including but not limited to  vaginal bleeding, contractions, leaking of fluid and fetal movement were reviewed in detail with the patient. Please refer to After Visit Summary for other counseling recommendations.   12/12/19 next appt with Dr Nehemiah Settle  Future Appointments  Date Time Provider [place]  11/12/2019 10:30 AM Seabron Spates, CNM [facility] None  11/12/2019  2:30 PM Anthon [place]    Hansel Feinstein, CNM

## 2019-11-13 ENCOUNTER — Other Ambulatory Visit (HOSPITAL_COMMUNITY): Payer: Self-pay | Admitting: *Deleted

## 2019-11-13 DIAGNOSIS — Z362 Encounter for other antenatal screening follow-up: Secondary | ICD-10-CM

## 2019-11-29 NOTE — L&D Delivery Note (Signed)
OB/GYN Faculty Practice Delivery Note  [name] is a 29 y.o. G2P2002 s/p NSVD at [redacted]w[redacted]d. She was admitted for SOL.   ROM: 2h 75m with clear fluid GBS Status:  Negative/-- (04/15 1153) Maximum Maternal Temperature: 98.1 F   Labor Progress: . Patient arrived at 10 cm dilation and had precipitous NSVD.  Delivery Date/Time: 04/03/2020 at 0201 Delivery: Called to room and patient was complete and pushing. Head delivered in OA position. No nuchal cord present. Shoulder and body delivered in usual fashion. Infant with spontaneous cry, placed on mother's abdomen, dried and stimulated. Cord clamped x 2 after 1-minute delay, and cut by FOB. Cord blood drawn. Placenta delivered spontaneously with gentle cord traction. Fundus firm with massage and Pitocin. Labia, perineum, vagina, and cervix inspected with clitoral hood and 1st degree perineal lacerations, both hemostatic and not repaired.   Placenta: 3v intact to L&D Complications: none Lacerations: clitoral hood and 1st degree perineal lacerations, both hemostatic and not repaired EBL: 50cc Analgesia: none   Infant: APGAR (1 MIN): 8   APGAR (5 MINS): 9    Weight: [id number] grams  Augustin Coupe, MD/[facility] OB/GYN Fellow, Faculty Practice

## 2019-12-10 ENCOUNTER — Other Ambulatory Visit: Payer: Self-pay

## 2019-12-10 ENCOUNTER — Ambulatory Visit (HOSPITAL_COMMUNITY)
Admission: RE | Admit: 2019-12-10 | Discharge: 2019-12-10 | Disposition: A | Discharge status: Home / Self Care | Payer: Medicaid Other | Source: Ambulatory Visit | Attending: Maternal & Fetal Medicine | Admitting: Maternal & Fetal Medicine

## 2019-12-10 DIAGNOSIS — Z3A23 23 weeks gestation of pregnancy: Secondary | ICD-10-CM

## 2019-12-10 DIAGNOSIS — Z362 Encounter for other antenatal screening follow-up: Secondary | ICD-10-CM | POA: Insufficient documentation

## 2019-12-10 DIAGNOSIS — O99212 Obesity complicating pregnancy, second trimester: Secondary | ICD-10-CM

## 2019-12-10 DIAGNOSIS — O99012 Anemia complicating pregnancy, second trimester: Secondary | ICD-10-CM

## 2019-12-10 LAB — SMN1 COPY NUMBER ANALYSIS (SMA CARRIER SCREENING)

## 2019-12-10 LAB — OBSTETRIC PANEL, INCLUDING HIV
Antibody Screen: NEGATIVE
Basophils Absolute: 0 10*3/uL (ref 0.0–0.2)
Basos: 0 %
EOS (ABSOLUTE): 0 10*3/uL (ref 0.0–0.4)
Eos: 0 %
HIV Screen 4th Generation wRfx: NONREACTIVE
Hematocrit: 33.9 % — ABNORMAL LOW (ref 34.0–46.6)
Hemoglobin: 11.2 g/dL (ref 11.1–15.9)
Hepatitis B Surface Ag: NEGATIVE
Immature Grans (Abs): 0 10*3/uL (ref 0.0–0.1)
Immature Granulocytes: 0 %
Lymphocytes Absolute: 1 10*3/uL (ref 0.7–3.1)
Lymphs: 21 %
MCH: 27.7 pg (ref 26.6–33.0)
MCHC: 33 g/dL (ref 31.5–35.7)
MCV: 84 fL (ref 79–97)
Monocytes Absolute: 0.4 10*3/uL (ref 0.1–0.9)
Monocytes: 8 %
Neutrophils Absolute: 3.2 10*3/uL (ref 1.4–7.0)
Neutrophils: 71 %
Platelets: 187 10*3/uL (ref 150–450)
RBC: 4.05 x10E6/uL (ref 3.77–5.28)
RDW: 13.5 % (ref 11.7–15.4)
RPR Ser Ql: NONREACTIVE
Rh Factor: POSITIVE
Rubella Antibodies, IGG: 1.74 index (ref >0.99)
WBC: 4.7 10*3/uL (ref 3.4–10.8)

## 2019-12-10 LAB — HEMOGLOBIN A1C
Est. average glucose Bld gHb Est-mCnc: 91 mg/dL
Hgb A1c MFr Bld: 4.8 % (ref 4.8–5.6)

## 2019-12-12 ENCOUNTER — Encounter: Payer: Self-pay | Admitting: Family Medicine

## 2019-12-16 ENCOUNTER — Encounter: Payer: Self-pay | Admitting: Obstetrics & Gynecology

## 2019-12-19 ENCOUNTER — Telehealth: Payer: Self-pay

## 2019-12-19 NOTE — Telephone Encounter (Signed)
Patient called stating she is having lower back pain.  Patient states she has been taking ibuprofen for this. Advised that during pregnancy we would like her to take tylenol. Patient also advised to elevate feet when she is off of them . Scheduled patient to see Dr. Nehemiah Settle to do manipulation.  I asked patient about her congestion and if she has been around anyone covid+ or symptomatic in last fourteen days. Patient replied no and that she has not been out of the house. Patient made aware that she could run humidifier by bed at night and take plain sudafed for congestation relief. Patient also made aware she could take a daily allegery medication like Claritin.  Patient states understanding and then ask if we give work notes. 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 Patient made aware that our work notes includes things like frequent breaks, not lifting heavier than 15 lbs, and ability to not stand for long periods of time. Patient states understanding and does not want note for that. Kathrene Alu RN

## 2019-12-26 ENCOUNTER — Encounter: Payer: Medicaid Other | Admitting: Family Medicine

## 2020-01-01 ENCOUNTER — Other Ambulatory Visit: Payer: Self-pay

## 2020-01-01 ENCOUNTER — Ambulatory Visit (INDEPENDENT_AMBULATORY_CARE_PROVIDER_SITE_OTHER): Payer: Medicaid Other | Admitting: Obstetrics & Gynecology

## 2020-01-01 VITALS — BP 111/64 | HR 94 | Wt 227.0 lb

## 2020-01-01 DIAGNOSIS — M549 Dorsalgia, unspecified: Secondary | ICD-10-CM

## 2020-01-01 DIAGNOSIS — Z8759 Personal history of other complications of pregnancy, childbirth and the puerperium: Secondary | ICD-10-CM

## 2020-01-01 DIAGNOSIS — O99891 Other specified diseases and conditions complicating pregnancy: Secondary | ICD-10-CM

## 2020-01-01 DIAGNOSIS — Z3480 Encounter for supervision of other normal pregnancy, unspecified trimester: Secondary | ICD-10-CM

## 2020-01-01 DIAGNOSIS — Z3A26 26 weeks gestation of pregnancy: Secondary | ICD-10-CM

## 2020-01-01 DIAGNOSIS — O99019 Anemia complicating pregnancy, unspecified trimester: Secondary | ICD-10-CM

## 2020-01-01 MED ORDER — CYCLOBENZAPRINE HCL 10 MG PO TABS: 10.0000 mg | ORAL_TABLET | Freq: Three times a day (TID) | ORAL | 1 refills | Status: DC | PRN

## 2020-01-01 NOTE — Progress Notes (Signed)
   PRENATAL VISIT NOTE  Subjective:  Mallory Harris is a 29 y.o. G2P1001 at [redacted]w[redacted]d being seen today for ongoing prenatal care.  She is currently monitored for the following issues for this low-risk pregnancy and has Anemia, antepartum; Supervision of other normal pregnancy, antepartum; and History of precipitous delivery on their problem list.  Patient reports backache. Pt feel like it is getting worse. It is positional. No injury noted. Contractions: Not present. Vag. Bleeding: None.  Movement: Present. Denies leaking of fluid.   The following portions of the patient's history were reviewed and updated as appropriate: allergies, current medications, past family history, past medical history, past social history, past surgical history and problem list.   Objective:   Vitals:   01/01/20 1452  BP: 111/64  Pulse: 94  Weight: 227 lb (103 kg)    Fetal Status: Fetal Heart Rate (bpm): 137   Movement: Present     General:  Alert, oriented and cooperative. Patient is in no acute distress.  Skin: Skin is warm and dry. No rash noted.   Cardiovascular: Normal heart rate noted  Respiratory: Normal respiratory effort, no problems with respiration noted  Abdomen: Soft, gravid, appropriate for gestational age.  Pain/Pressure: Present     Pelvic: Cervical exam deferred        Extremities: Normal range of motion.  Edema: None  Mental Status: Normal mood and affect. Normal behavior. Normal judgment and thought content.   Assessment and Plan:  Pregnancy: G2P1001 at [redacted]w[redacted]d 1. Supervision of other normal pregnancy, antepartum Good FM  FH - WNL    2. History of precipitous delivery  3. Anemia, antepartum Taking FeSO4  4. Back pain Referral for PT Fleverile 10mg  tid prn appt with Dr. Nehemiah Settle for next OB appt for eval    Preterm labor symptoms and general obstetric precautions including but not limited to vaginal bleeding, contractions, leaking of fluid and fetal movement were reviewed in detail  with the patient. Please refer to After Visit Summary for other counseling recommendations.   No follow-ups on file.  Future Appointments  Date Time Provider [place]  01/17/2020  8:30 AM Truett Mainland, DO [facility] None    Lavonia Drafts, MD

## 2020-01-02 ENCOUNTER — Other Ambulatory Visit: Payer: Medicaid Other

## 2020-01-03 ENCOUNTER — Other Ambulatory Visit: Payer: Medicaid Other

## 2020-01-03 ENCOUNTER — Other Ambulatory Visit: Payer: Self-pay

## 2020-01-03 DIAGNOSIS — Z3480 Encounter for supervision of other normal pregnancy, unspecified trimester: Secondary | ICD-10-CM | POA: Diagnosis not present

## 2020-01-03 NOTE — Progress Notes (Signed)
Pt presents to the office for 2 hr GTT. Pt was given lab slip and sent to the lab. Mallory Harris l Tadao Emig, CMA

## 2020-01-04 LAB — HIV ANTIBODY (ROUTINE TESTING W REFLEX): HIV Screen 4th Generation wRfx: NONREACTIVE

## 2020-01-04 LAB — GLUCOSE TOLERANCE, 2 HOURS W/ 1HR
Glucose, 1 hour: 131 mg/dL (ref 65–179)
Glucose, 2 hour: 100 mg/dL (ref 65–152)
Glucose, Fasting: 79 mg/dL (ref 65–91)

## 2020-01-04 LAB — RPR: RPR Ser Ql: NONREACTIVE

## 2020-01-04 LAB — CBC
Hematocrit: 30.6 % — ABNORMAL LOW (ref 34.0–46.6)
Hemoglobin: 9.8 g/dL — ABNORMAL LOW (ref 11.1–15.9)
MCH: 27.7 pg (ref 26.6–33.0)
MCHC: 32 g/dL (ref 31.5–35.7)
MCV: 86 fL (ref 79–97)
Platelets: 189 10*3/uL (ref 150–450)
RBC: 3.54 x10E6/uL — ABNORMAL LOW (ref 3.77–5.28)
RDW: 13.4 % (ref 11.7–15.4)
WBC: 7.8 10*3/uL (ref 3.4–10.8)

## 2020-01-17 ENCOUNTER — Other Ambulatory Visit: Payer: Self-pay

## 2020-01-17 ENCOUNTER — Ambulatory Visit (INDEPENDENT_AMBULATORY_CARE_PROVIDER_SITE_OTHER): Payer: Medicaid Other | Admitting: Family Medicine

## 2020-01-17 VITALS — BP 109/58 | HR 78 | Wt 228.0 lb

## 2020-01-17 DIAGNOSIS — O99013 Anemia complicating pregnancy, third trimester: Secondary | ICD-10-CM

## 2020-01-17 DIAGNOSIS — M9905 Segmental and somatic dysfunction of pelvic region: Secondary | ICD-10-CM

## 2020-01-17 DIAGNOSIS — M9903 Segmental and somatic dysfunction of lumbar region: Secondary | ICD-10-CM | POA: Diagnosis not present

## 2020-01-17 DIAGNOSIS — O99893 Other specified diseases and conditions complicating puerperium: Secondary | ICD-10-CM | POA: Diagnosis not present

## 2020-01-17 DIAGNOSIS — M549 Dorsalgia, unspecified: Secondary | ICD-10-CM

## 2020-01-17 DIAGNOSIS — Z23 Encounter for immunization: Secondary | ICD-10-CM | POA: Diagnosis not present

## 2020-01-17 DIAGNOSIS — M9904 Segmental and somatic dysfunction of sacral region: Secondary | ICD-10-CM

## 2020-01-17 DIAGNOSIS — Z3A28 28 weeks gestation of pregnancy: Secondary | ICD-10-CM | POA: Diagnosis not present

## 2020-01-17 DIAGNOSIS — O99019 Anemia complicating pregnancy, unspecified trimester: Secondary | ICD-10-CM

## 2020-01-17 DIAGNOSIS — Z3480 Encounter for supervision of other normal pregnancy, unspecified trimester: Secondary | ICD-10-CM

## 2020-01-17 NOTE — Progress Notes (Signed)
   PRENATAL VISIT NOTE  Subjective:  Mallory Harris is a 29 y.o. G2P1001 at [redacted]w[redacted]d being seen today for ongoing prenatal care.  She is currently monitored for the following issues for this low-risk pregnancy and has Anemia, antepartum; Supervision of other normal pregnancy, antepartum; and History of precipitous delivery on their problem list.  Patient reports back pain on left side, radiates into buttocks. Having difficulty transitioning from sitting to standing. Feels stiff. .  Contractions: Not present.  .  Movement: Present. Denies leaking of fluid.   The following portions of the patient's history were reviewed and updated as appropriate: allergies, current medications, past family history, past medical history, past social history, past surgical history and problem list. Problem list updated.  Objective:   Vitals:   01/17/20 1021  BP: (!) 109/58  Pulse: 78  Weight: 228 lb (103.4 kg)    Fetal Status: Fetal Heart Rate (bpm): 132   Movement: Present     General:  Alert, oriented and cooperative. Patient is in no acute distress.  Skin: Skin is warm and dry. No rash noted.   Cardiovascular: Normal heart rate noted  Respiratory: Normal respiratory effort, no problems with respiration noted  Abdomen: Soft, gravid, appropriate for gestational age. Pain/Pressure: Absent     Pelvic:  Cervical exam deferred        MSK: Restriction, tenderness, tissue texture changes, and paraspinal spasm in the left lumbar spine  Neuro: Moves all four extremities with no focal neurological deficit  Extremities: Normal range of motion.  Edema: Trace  Mental Status: Normal mood and affect. Normal behavior. Normal judgment and thought content.   OSE: Head   Cervical   Thoracic   Rib   Lumbar L5 ESRL, L1ESRR  Sacrum L/L  Pelvis Right ant.    Assessment and Plan:  Pregnancy: G2P1001 at [redacted]w[redacted]d  1. Supervision of other normal pregnancy, antepartum FHT and FH normal  2. Anemia, antepartum Iron  BID  3. Back pain affecting pregnancy, antepartum 4. Somatic dysfunction of lumbar region 5. Somatic dysfunction of sacral region 6. Somatic dysfunction of pelvis region OMT done after patient permission. HVLA technique utilized. 3 areas treated with improvement of tissue texture and joint mobility. Patient tolerated procedure well.    Preterm labor symptoms and general obstetric precautions including but not limited to vaginal bleeding, contractions, leaking of fluid and fetal movement were reviewed in detail with the patient. Please refer to After Visit Summary for other counseling recommendations.  Return in about 2 weeks (around 01/31/2020) for OB f/u, In Office.  Truett Mainland, DO

## 2020-01-17 NOTE — Progress Notes (Signed)
TDAP TODAY!

## 2020-01-30 ENCOUNTER — Other Ambulatory Visit: Payer: Self-pay

## 2020-01-30 ENCOUNTER — Ambulatory Visit (INDEPENDENT_AMBULATORY_CARE_PROVIDER_SITE_OTHER): Payer: Medicaid Other | Admitting: Family Medicine

## 2020-01-30 VITALS — BP 110/63 | HR 78 | Wt 227.0 lb

## 2020-01-30 DIAGNOSIS — O99891 Other specified diseases and conditions complicating pregnancy: Secondary | ICD-10-CM | POA: Diagnosis not present

## 2020-01-30 DIAGNOSIS — M9905 Segmental and somatic dysfunction of pelvic region: Secondary | ICD-10-CM

## 2020-01-30 DIAGNOSIS — M9903 Segmental and somatic dysfunction of lumbar region: Secondary | ICD-10-CM

## 2020-01-30 DIAGNOSIS — Z3A30 30 weeks gestation of pregnancy: Secondary | ICD-10-CM

## 2020-01-30 DIAGNOSIS — M9904 Segmental and somatic dysfunction of sacral region: Secondary | ICD-10-CM

## 2020-01-30 DIAGNOSIS — O99013 Anemia complicating pregnancy, third trimester: Secondary | ICD-10-CM

## 2020-01-30 DIAGNOSIS — O99019 Anemia complicating pregnancy, unspecified trimester: Secondary | ICD-10-CM

## 2020-01-30 DIAGNOSIS — M549 Dorsalgia, unspecified: Secondary | ICD-10-CM

## 2020-01-30 DIAGNOSIS — Z3480 Encounter for supervision of other normal pregnancy, unspecified trimester: Secondary | ICD-10-CM

## 2020-01-30 NOTE — Progress Notes (Signed)
   PRENATAL VISIT NOTE  Subjective:  Mallory Harris is a 29 y.o. G2P1001 at [redacted]w[redacted]d being seen today for ongoing prenatal care.  She is currently monitored for the following issues for this low-risk pregnancy and has Anemia, antepartum; Supervision of other normal pregnancy, antepartum; and History of precipitous delivery on their problem list.  Patient reports back pain improved after OMT last visit. Has started to become worse again, but not as bad as before. Pain still worse with prolonged sitting. has been trying to move around..  Contractions: Not present. Vag. Bleeding: Scant.  Movement: Present. Denies leaking of fluid.   The following portions of the patient's history were reviewed and updated as appropriate: allergies, current medications, past family history, past medical history, past social history, past surgical history and problem list. Problem list updated.  Objective:   Vitals:   01/30/20 1504  BP: 110/63  Pulse: 78  Weight: 227 lb (103 kg)    Fetal Status: Fetal Heart Rate (bpm): 138   Movement: Present     General:  Alert, oriented and cooperative. Patient is in no acute distress.  Skin: Skin is warm and dry. No rash noted.   Cardiovascular: Normal heart rate noted  Respiratory: Normal respiratory effort, no problems with respiration noted  Abdomen: Soft, gravid, appropriate for gestational age. Pain/Pressure: Absent     Pelvic:  Cervical exam deferred        MSK: Restriction, tenderness, tissue texture changes, and paraspinal spasm in the lumbar spine  Neuro: Moves all four extremities with no focal neurological deficit  Extremities: Normal range of motion.  Edema: Trace  Mental Status: Normal mood and affect. Normal behavior. Normal judgment and thought content.   OSE: Head   Cervical   Thoracic   Rib   Lumbar L5 ESRL  Sacrum L/L  Pelvis Right ant innom    Assessment and Plan:  Pregnancy: G2P1001 at [redacted]w[redacted]d  1. Supervision of other normal pregnancy,  antepartum FHT and FH normal  2. Anemia, antepartum Taking iron, BID  3. Back pain affecting pregnancy, antepartum 4. Somatic dysfunction of lumbar region 5. Somatic dysfunction of sacral region 6. Somatic dysfunction of pelvis region OMT done after patient permission. HVLA technique utilized. 3 areas treated with improvement of tissue texture and joint mobility. Patient tolerated procedure well.    Preterm labor symptoms and general obstetric precautions including but not limited to vaginal bleeding, contractions, leaking of fluid and fetal movement were reviewed in detail with the patient. Please refer to After Visit Summary for other counseling recommendations.  No follow-ups on file.  Truett Mainland, DO

## 2020-02-13 ENCOUNTER — Other Ambulatory Visit: Payer: Self-pay

## 2020-02-13 ENCOUNTER — Ambulatory Visit (INDEPENDENT_AMBULATORY_CARE_PROVIDER_SITE_OTHER): Payer: Medicaid Other | Admitting: Family Medicine

## 2020-02-13 ENCOUNTER — Encounter: Payer: Self-pay | Admitting: Family Medicine

## 2020-02-13 VITALS — BP 96/67 | Wt 226.0 lb

## 2020-02-13 DIAGNOSIS — M9904 Segmental and somatic dysfunction of sacral region: Secondary | ICD-10-CM

## 2020-02-13 DIAGNOSIS — O99013 Anemia complicating pregnancy, third trimester: Secondary | ICD-10-CM | POA: Diagnosis not present

## 2020-02-13 DIAGNOSIS — M9901 Segmental and somatic dysfunction of cervical region: Secondary | ICD-10-CM

## 2020-02-13 DIAGNOSIS — M9902 Segmental and somatic dysfunction of thoracic region: Secondary | ICD-10-CM

## 2020-02-13 DIAGNOSIS — M9908 Segmental and somatic dysfunction of rib cage: Secondary | ICD-10-CM

## 2020-02-13 DIAGNOSIS — M9900 Segmental and somatic dysfunction of head region: Secondary | ICD-10-CM | POA: Diagnosis not present

## 2020-02-13 DIAGNOSIS — G44209 Tension-type headache, unspecified, not intractable: Secondary | ICD-10-CM

## 2020-02-13 DIAGNOSIS — M549 Dorsalgia, unspecified: Secondary | ICD-10-CM

## 2020-02-13 DIAGNOSIS — Z3A32 32 weeks gestation of pregnancy: Secondary | ICD-10-CM

## 2020-02-13 DIAGNOSIS — M9903 Segmental and somatic dysfunction of lumbar region: Secondary | ICD-10-CM

## 2020-02-13 DIAGNOSIS — O99891 Other specified diseases and conditions complicating pregnancy: Secondary | ICD-10-CM

## 2020-02-13 DIAGNOSIS — O99019 Anemia complicating pregnancy, unspecified trimester: Secondary | ICD-10-CM

## 2020-02-13 DIAGNOSIS — M9905 Segmental and somatic dysfunction of pelvic region: Secondary | ICD-10-CM

## 2020-02-13 DIAGNOSIS — Z3480 Encounter for supervision of other normal pregnancy, unspecified trimester: Secondary | ICD-10-CM

## 2020-02-13 NOTE — Progress Notes (Signed)
   PRENATAL VISIT NOTE  Subjective:  Mallory Harris is a 29 y.o. G2P1001 at [redacted]w[redacted]d being seen today for ongoing prenatal care.  She is currently monitored for the following issues for this low-risk pregnancy and has Anemia, antepartum; Supervision of other normal pregnancy, antepartum; and History of precipitous delivery on their problem list.  Patient reports backache and headache. Back pain improved with manipulation last appointment. Frequent headaches - gets nauseated with headaches. Contractions: Irritability. Vag. Bleeding: None.  Movement: Present. Denies leaking of fluid.   The following portions of the patient's history were reviewed and updated as appropriate: allergies, current medications, past family history, past medical history, past social history, past surgical history and problem list. Problem list updated.  Objective:   Vitals:   02/13/20 1625  BP: 96/67  Weight: 226 lb (102.5 kg)    Fetal Status:     Movement: Present     General:  Alert, oriented and cooperative. Patient is in no acute distress.  Skin: Skin is warm and dry. No rash noted.   Cardiovascular: Normal heart rate noted  Respiratory: Normal respiratory effort, no problems with respiration noted  Abdomen: Soft, gravid, appropriate for gestational age. Pain/Pressure: Present     Pelvic:  Cervical exam deferred        MSK: Restriction, tenderness, tissue texture changes, and paraspinal spasm in the lumbar and cervical spine  Neuro: Moves all four extremities with no focal neurological deficit  Extremities: Normal range of motion.  Edema: Trace  Mental Status: Normal mood and affect. Normal behavior. Normal judgment and thought content.   OSE: Head OA SRRL  Cervical C5 FSRR  Thoracic T4 FSRR  Rib Right rib inhaled  Lumbar L5 ESRL  Sacrum L/L  Pelvis Right ant innom    Assessment and Plan:  Pregnancy: G2P1001 at [redacted]w[redacted]d  1. Supervision of other normal pregnancy, antepartum FHT and FH normal  2.  Anemia, antepartum On iron  3. Back pain affecting pregnancy, antepartum 4. Tension headache 5. Somatic dysfunction of head region 6. Somatic dysfunction of spine, cervical 7. Somatic dysfunction of spine, thoracic 8. Somatic dysfunction of rib 9. Somatic dysfunction of pelvis region 10. Somatic dysfunction of lumbar region 11. Somatic dysfunction of sacral region OMT done after patient permission. HVLA technique utilized. 7 areas treated with improvement of tissue texture and joint mobility. Patient tolerated procedure well.    Preterm labor symptoms and general obstetric precautions including but not limited to vaginal bleeding, contractions, leaking of fluid and fetal movement were reviewed in detail with the patient. Please refer to After Visit Summary for other counseling recommendations.  No follow-ups on file.  Truett Mainland, DO

## 2020-02-14 ENCOUNTER — Encounter: Payer: Medicaid Other | Admitting: Obstetrics & Gynecology

## 2020-02-27 ENCOUNTER — Other Ambulatory Visit: Payer: Self-pay

## 2020-02-27 ENCOUNTER — Ambulatory Visit (INDEPENDENT_AMBULATORY_CARE_PROVIDER_SITE_OTHER): Payer: Medicaid Other | Admitting: Family Medicine

## 2020-02-27 VITALS — BP 114/74 | HR 85 | Wt 227.0 lb

## 2020-02-27 DIAGNOSIS — M9902 Segmental and somatic dysfunction of thoracic region: Secondary | ICD-10-CM

## 2020-02-27 DIAGNOSIS — M9905 Segmental and somatic dysfunction of pelvic region: Secondary | ICD-10-CM

## 2020-02-27 DIAGNOSIS — M9900 Segmental and somatic dysfunction of head region: Secondary | ICD-10-CM

## 2020-02-27 DIAGNOSIS — M9901 Segmental and somatic dysfunction of cervical region: Secondary | ICD-10-CM | POA: Diagnosis not present

## 2020-02-27 DIAGNOSIS — G44209 Tension-type headache, unspecified, not intractable: Secondary | ICD-10-CM

## 2020-02-27 DIAGNOSIS — M9904 Segmental and somatic dysfunction of sacral region: Secondary | ICD-10-CM

## 2020-02-27 DIAGNOSIS — M9908 Segmental and somatic dysfunction of rib cage: Secondary | ICD-10-CM

## 2020-02-27 DIAGNOSIS — Z3A34 34 weeks gestation of pregnancy: Secondary | ICD-10-CM

## 2020-02-27 DIAGNOSIS — M9903 Segmental and somatic dysfunction of lumbar region: Secondary | ICD-10-CM

## 2020-02-27 DIAGNOSIS — O99353 Diseases of the nervous system complicating pregnancy, third trimester: Secondary | ICD-10-CM

## 2020-02-27 DIAGNOSIS — Z3480 Encounter for supervision of other normal pregnancy, unspecified trimester: Secondary | ICD-10-CM

## 2020-02-27 DIAGNOSIS — M549 Dorsalgia, unspecified: Secondary | ICD-10-CM

## 2020-02-27 DIAGNOSIS — O26893 Other specified pregnancy related conditions, third trimester: Secondary | ICD-10-CM

## 2020-02-27 NOTE — Progress Notes (Addendum)
   PRENATAL VISIT NOTE  Subjective:  Mallory Harris is a 29 y.o. G2P1001 at [redacted]w[redacted]d being seen today for ongoing prenatal care.  She is currently monitored for the following issues for this low-risk pregnancy and has Anemia, antepartum; Supervision of other normal pregnancy, antepartum; and History of precipitous delivery on their problem list.  Patient reports backache and headache. Both improved with OMT, but still present.  Contractions: Irritability. Vag. Bleeding: Scant.  Movement: Present. Denies leaking of fluid.   The following portions of the patient's history were reviewed and updated as appropriate: allergies, current medications, past family history, past medical history, past social history, past surgical history and problem list. Problem list updated.  Objective:   Vitals:   02/27/20 1114  BP: 114/74  Pulse: 85  Weight: 227 lb (103 kg)    Fetal Status: Fetal Heart Rate (bpm): 138   Movement: Present     General:  Alert, oriented and cooperative. Patient is in no acute distress.  Skin: Skin is warm and dry. No rash noted.   Cardiovascular: Normal heart rate noted  Respiratory: Normal respiratory effort, no problems with respiration noted  Abdomen: Soft, gravid, appropriate for gestational age. Pain/Pressure: Present     Pelvic:  Cervical exam deferred        MSK: Restriction, tenderness, tissue texture changes, and paraspinal spasm in the cervical and lumbar spine  Neuro: Moves all four extremities with no focal neurological deficit  Extremities: Normal range of motion.  Edema: Trace  Mental Status: Normal mood and affect. Normal behavior. Normal judgment and thought content.   OSE: Head SRRL  Cervical C3 FSRR  Thoracic T4 FSRR, T5 FSRL  Rib Rib 4 inhaled  Lumbar L5 ESRL  Sacrum L/L  Pelvis Right ant innom    Assessment and Plan:  Pregnancy: G2P1001 at [redacted]w[redacted]d  1. Supervision of other normal pregnancy, antepartum fht and FH normal.  2. Back pain affecting  pregnancy, antepartum  3. Tension headache Improving - continue OMT. No new symptoms to cause suspicion for preeclampsia  4. Somatic dysfunction of head region 5. Somatic dysfunction of spine, cervical 6. Somatic dysfunction of spine, thoracic 7. Somatic dysfunction of rib 8. Somatic dysfunction of pelvis region 9. Somatic dysfunction of lumbar region 10. Somatic dysfunction of sacral region OMT done after patient permission. HVLA technique utilized. 7 areas treated with improvement of tissue texture and joint mobility. Patient tolerated procedure well.    Preterm labor symptoms and general obstetric precautions including but not limited to vaginal bleeding, contractions, leaking of fluid and fetal movement were reviewed in detail with the patient. Please refer to After Visit Summary for other counseling recommendations.  No follow-ups on file.  Truett Mainland, DO

## 2020-03-12 ENCOUNTER — Ambulatory Visit (INDEPENDENT_AMBULATORY_CARE_PROVIDER_SITE_OTHER): Payer: Medicaid Other | Admitting: Family Medicine

## 2020-03-12 ENCOUNTER — Other Ambulatory Visit: Payer: Self-pay

## 2020-03-12 ENCOUNTER — Other Ambulatory Visit (HOSPITAL_COMMUNITY)
Admission: RE | Admit: 2020-03-12 | Discharge: 2020-03-12 | Disposition: A | Discharge status: Home / Self Care | Payer: Medicaid Other | Source: Ambulatory Visit | Attending: Family Medicine | Admitting: Family Medicine

## 2020-03-12 VITALS — BP 123/66 | HR 87 | Wt 226.0 lb

## 2020-03-12 DIAGNOSIS — Z3480 Encounter for supervision of other normal pregnancy, unspecified trimester: Secondary | ICD-10-CM

## 2020-03-12 DIAGNOSIS — Z3A36 36 weeks gestation of pregnancy: Secondary | ICD-10-CM

## 2020-03-12 DIAGNOSIS — Z3483 Encounter for supervision of other normal pregnancy, third trimester: Secondary | ICD-10-CM

## 2020-03-12 NOTE — Progress Notes (Signed)
   PRENATAL VISIT NOTE  Subjective:  Mallory Harris is a 29 y.o. G2P1001 at [redacted]w[redacted]d being seen today for ongoing prenatal care.  She is currently monitored for the following issues for this low-risk pregnancy and has Anemia, antepartum; Supervision of other normal pregnancy, antepartum; and History of precipitous delivery on their problem list.  Patient reports no complaints. Headaches improved afte manipulation Contractions: Irregular. Vag. Bleeding: None.  Movement: Present. Denies leaking of fluid.   The following portions of the patient's history were reviewed and updated as appropriate: allergies, current medications, past family history, past medical history, past social history, past surgical history and problem list.   Objective:   Vitals:   03/12/20 1122  BP: 123/66  Pulse: 87  Weight: 226 lb (102.5 kg)    Fetal Status: Fetal Heart Rate (bpm): 138   Movement: Present     General:  Alert, oriented and cooperative. Patient is in no acute distress.  Skin: Skin is warm and dry. No rash noted.   Cardiovascular: Normal heart rate noted  Respiratory: Normal respiratory effort, no problems with respiration noted  Abdomen: Soft, gravid, appropriate for gestational age.  Pain/Pressure: Present     Pelvic: Cervical exam deferred        Extremities: Normal range of motion.  Edema: Trace  Mental Status: Normal mood and affect. Normal behavior. Normal judgment and thought content.   Assessment and Plan:  Pregnancy: G2P1001 at [redacted]w[redacted]d 1. Supervision of other normal pregnancy, antepartum FHT and FH normal - Cervicovaginal ancillary only( [place]) - Culture, beta strep (group b only)  Preterm labor symptoms and general obstetric precautions including but not limited to vaginal bleeding, contractions, leaking of fluid and fetal movement were reviewed in detail with the patient. Please refer to After Visit Summary for other counseling recommendations.   No follow-ups on file.  Future  Appointments  Date Time Provider [place]  03/19/2020  4:15 PM Truett Mainland, DO [facility] None    Truett Mainland, DO

## 2020-03-13 LAB — CERVICOVAGINAL ANCILLARY ONLY
Chlamydia: NEGATIVE
Comment: NEGATIVE
Comment: NORMAL
Neisseria Gonorrhea: NEGATIVE

## 2020-03-16 LAB — CULTURE, BETA STREP (GROUP B ONLY): Strep Gp B Culture: NEGATIVE

## 2020-03-19 ENCOUNTER — Other Ambulatory Visit: Payer: Self-pay

## 2020-03-19 ENCOUNTER — Encounter: Payer: Self-pay | Admitting: Family Medicine

## 2020-03-19 ENCOUNTER — Ambulatory Visit (INDEPENDENT_AMBULATORY_CARE_PROVIDER_SITE_OTHER): Payer: Medicaid Other | Admitting: Family Medicine

## 2020-03-19 VITALS — BP 115/58 | HR 81 | Wt 223.0 lb

## 2020-03-19 DIAGNOSIS — Z3483 Encounter for supervision of other normal pregnancy, third trimester: Secondary | ICD-10-CM

## 2020-03-19 DIAGNOSIS — Z3A37 37 weeks gestation of pregnancy: Secondary | ICD-10-CM

## 2020-03-19 DIAGNOSIS — Z3480 Encounter for supervision of other normal pregnancy, unspecified trimester: Secondary | ICD-10-CM

## 2020-03-19 MED ORDER — ONDANSETRON 4 MG PO TBDP: 4.0000 mg | ORAL_TABLET | Freq: Four times a day (QID) | ORAL | 0 refills | Status: DC | PRN

## 2020-03-19 MED ORDER — FAMOTIDINE 20 MG PO TABS: 20.0000 mg | ORAL_TABLET | Freq: Two times a day (BID) | ORAL | 3 refills | Status: DC

## 2020-03-19 NOTE — Progress Notes (Signed)
   PRENATAL VISIT NOTE  Subjective:  Mallory Harris is a 29 y.o. G2P1001 at [redacted]w[redacted]d being seen today for ongoing prenatal care.  She is currently monitored for the following issues for this low-risk pregnancy and has Anemia, antepartum; Supervision of other normal pregnancy, antepartum; and History of precipitous delivery on their problem list.  Patient reports no complaints.  Contractions: Not present. Vag. Bleeding: None.  Movement: Present. Denies leaking of fluid.   The following portions of the patient's history were reviewed and updated as appropriate: allergies, current medications, past family history, past medical history, past social history, past surgical history and problem list.   Objective:   Vitals:   03/19/20 1625  BP: (!) 115/58  Pulse: 81  Weight: 223 lb (101.2 kg)    Fetal Status: Fetal Heart Rate (bpm): 135 Fundal Height: 37 cm Movement: Present  Presentation: Vertex  General:  Alert, oriented and cooperative. Patient is in no acute distress.  Skin: Skin is warm and dry. No rash noted.   Cardiovascular: Normal heart rate noted  Respiratory: Normal respiratory effort, no problems with respiration noted  Abdomen: Soft, gravid, appropriate for gestational age.  Pain/Pressure: Present     Pelvic: Cervical exam deferred        Extremities: Normal range of motion.  Edema: Trace  Mental Status: Normal mood and affect. Normal behavior. Normal judgment and thought content.   Assessment and Plan:  Pregnancy: G2P1001 at [redacted]w[redacted]d  1. Supervision of other normal pregnancy, antepartum FhT and FH normal. Having heartburn and nausea. Will give pepcid and zofran. Start maternity leave now.   Preterm labor symptoms and general obstetric precautions including but not limited to vaginal bleeding, contractions, leaking of fluid and fetal movement were reviewed in detail with the patient. Please refer to After Visit Summary for other counseling recommendations.   Return in about 1  week (around 03/26/2020) for OB f/u.  No future appointments.  Truett Mainland, DO

## 2020-03-26 ENCOUNTER — Other Ambulatory Visit: Payer: Self-pay

## 2020-03-26 ENCOUNTER — Ambulatory Visit (INDEPENDENT_AMBULATORY_CARE_PROVIDER_SITE_OTHER): Payer: Medicaid Other | Admitting: Family Medicine

## 2020-03-26 VITALS — BP 113/66 | HR 85 | Wt 230.0 lb

## 2020-03-26 DIAGNOSIS — Z3480 Encounter for supervision of other normal pregnancy, unspecified trimester: Secondary | ICD-10-CM

## 2020-03-26 NOTE — Progress Notes (Signed)
   PRENATAL VISIT NOTE  Subjective:  Mallory Harris is a 29 y.o. G2P1001 at [redacted]w[redacted]d being seen today for ongoing prenatal care.  She is currently monitored for the following issues for this low-risk pregnancy and has Anemia, antepartum; Supervision of other normal pregnancy, antepartum; and History of precipitous delivery on their problem list.  Patient reports no complaints.  Contractions: Irregular. Vag. Bleeding: None.  Movement: Present. Denies leaking of fluid.   The following portions of the patient's history were reviewed and updated as appropriate: allergies, current medications, past family history, past medical history, past social history, past surgical history and problem list.   Objective:   Vitals:   03/26/20 1333  BP: 113/66  Pulse: 85  Weight: 230 lb (104.3 kg)    Fetal Status: Fetal Heart Rate (bpm): 132 Fundal Height: 38 cm Movement: Present  Presentation: Vertex  General:  Alert, oriented and cooperative. Patient is in no acute distress.  Skin: Skin is warm and dry. No rash noted.   Cardiovascular: Normal heart rate noted  Respiratory: Normal respiratory effort, no problems with respiration noted  Abdomen: Soft, gravid, appropriate for gestational age.  Pain/Pressure: Present     Pelvic: Cervical exam deferred        Extremities: Normal range of motion.  Edema: None  Mental Status: Normal mood and affect. Normal behavior. Normal judgment and thought content.   Assessment and Plan:  Pregnancy: G2P1001 at [redacted]w[redacted]d 1. Supervision of other normal pregnancy, antepartum FHT and FH normal  Preterm labor symptoms and general obstetric precautions including but not limited to vaginal bleeding, contractions, leaking of fluid and fetal movement were reviewed in detail with the patient. Please refer to After Visit Summary for other counseling recommendations.   No follow-ups on file.  Future Appointments  Date Time Provider [place]  04/02/2020  1:15 PM  Lavonia Drafts, MD [facility] None  04/09/2020  1:45 PM Truett Mainland, DO [facility] None    Truett Mainland, DO

## 2020-04-02 ENCOUNTER — Other Ambulatory Visit: Payer: Self-pay

## 2020-04-02 ENCOUNTER — Ambulatory Visit (INDEPENDENT_AMBULATORY_CARE_PROVIDER_SITE_OTHER): Payer: Medicaid Other | Admitting: Obstetrics & Gynecology

## 2020-04-02 ENCOUNTER — Encounter: Payer: Self-pay | Admitting: Obstetrics & Gynecology

## 2020-04-02 VITALS — BP 117/56 | HR 81 | Wt 229.0 lb

## 2020-04-02 DIAGNOSIS — Z3480 Encounter for supervision of other normal pregnancy, unspecified trimester: Secondary | ICD-10-CM

## 2020-04-02 DIAGNOSIS — Z8759 Personal history of other complications of pregnancy, childbirth and the puerperium: Secondary | ICD-10-CM

## 2020-04-02 DIAGNOSIS — O99019 Anemia complicating pregnancy, unspecified trimester: Secondary | ICD-10-CM

## 2020-04-02 NOTE — Progress Notes (Signed)
   PRENATAL VISIT NOTE  Subjective:  Mallory Harris is a 29 y.o. G2P1001 at [redacted]w[redacted]d being seen today for ongoing prenatal care.  She is currently monitored for the following issues for this low-risk pregnancy and has Anemia, antepartum; Supervision of other normal pregnancy, antepartum; and History of precipitous delivery on their problem list.  Patient reports occasional contractions.  Contractions: Irregular. Vag. Bleeding: None.  Movement: Present. Denies leaking of fluid.   The following portions of the patient's history were reviewed and updated as appropriate: allergies, current medications, past family history, past medical history, past social history, past surgical history and problem list.   Objective:   Vitals:   04/02/20 1315  BP: (!) 117/56  Pulse: 81  Weight: 229 lb (103.9 kg)    Fetal Status:     Movement: Present     General:  Alert, oriented and cooperative. Patient is in no acute distress.  Skin: Skin is warm and dry. No rash noted.   Cardiovascular: Normal heart rate noted  Respiratory: Normal respiratory effort, no problems with respiration noted  Abdomen: Soft, gravid, appropriate for gestational age.  Pain/Pressure: Present     Pelvic: Cervical exam performed in the presence of a chaperone        Extremities: Normal range of motion.  Edema: None  Mental Status: Normal mood and affect. Normal behavior. Normal judgment and thought content.   Assessment and Plan:  Pregnancy: G2P1001 at [redacted]w[redacted]d 1. Supervision of other normal pregnancy, antepartum FH and FHR WNL  2. Anemia, antepartum  3. History of precipitous delivery Reviewed labor precautions as pt has advance cervical dilation but, is not in labor  Term labor symptoms and general obstetric precautions including but not limited to vaginal bleeding, contractions, leaking of fluid and fetal movement were reviewed in detail with the patient. Please refer to After Visit Summary for other counseling  recommendations.   Return in about 1 week (around 04/09/2020).  Future Appointments  Date Time Provider [place]  04/09/2020  1:45 PM Truett Mainland, DO [facility] None    Lavonia Drafts, MD

## 2020-04-02 NOTE — Patient Instructions (Signed)

## 2020-04-03 ENCOUNTER — Inpatient Hospital Stay (HOSPITAL_COMMUNITY)
Admission: AD | Admit: 2020-04-03 | Discharge: 2020-04-04 | DRG: 807 | Disposition: A | Discharge status: Home / Self Care | Payer: Medicaid Other | Attending: Obstetrics and Gynecology | Admitting: Obstetrics and Gynecology

## 2020-04-03 ENCOUNTER — Encounter (HOSPITAL_COMMUNITY): Payer: Self-pay | Admitting: Obstetrics and Gynecology

## 2020-04-03 DIAGNOSIS — Z20822 Contact with and (suspected) exposure to covid-19: Secondary | ICD-10-CM | POA: Diagnosis present

## 2020-04-03 DIAGNOSIS — O9902 Anemia complicating childbirth: Secondary | ICD-10-CM | POA: Diagnosis present

## 2020-04-03 DIAGNOSIS — O99019 Anemia complicating pregnancy, unspecified trimester: Secondary | ICD-10-CM | POA: Diagnosis present

## 2020-04-03 DIAGNOSIS — Z8759 Personal history of other complications of pregnancy, childbirth and the puerperium: Secondary | ICD-10-CM

## 2020-04-03 DIAGNOSIS — Z3A39 39 weeks gestation of pregnancy: Secondary | ICD-10-CM

## 2020-04-03 DIAGNOSIS — O26893 Other specified pregnancy related conditions, third trimester: Secondary | ICD-10-CM | POA: Diagnosis present

## 2020-04-03 DIAGNOSIS — D649 Anemia, unspecified: Secondary | ICD-10-CM | POA: Diagnosis present

## 2020-04-03 DIAGNOSIS — Z3480 Encounter for supervision of other normal pregnancy, unspecified trimester: Secondary | ICD-10-CM

## 2020-04-03 LAB — TYPE AND SCREEN
ABO/RH(D): A POS
Antibody Screen: NEGATIVE

## 2020-04-03 LAB — RESPIRATORY PANEL BY RT PCR (FLU A&B, COVID)
Influenza A by PCR: NEGATIVE
Influenza B by PCR: NEGATIVE
SARS Coronavirus 2 by RT PCR: NEGATIVE

## 2020-04-03 LAB — RPR: RPR Ser Ql: NONREACTIVE

## 2020-04-03 LAB — CBC
HCT: 31.6 % — ABNORMAL LOW (ref 36.0–46.0)
Hemoglobin: 9.8 g/dL — ABNORMAL LOW (ref 12.0–15.0)
MCH: 27.5 pg (ref 26.0–34.0)
MCHC: 31 g/dL (ref 30.0–36.0)
MCV: 88.8 fL (ref 80.0–100.0)
Platelets: 197 10*3/uL (ref 150–400)
RBC: 3.56 MIL/uL — ABNORMAL LOW (ref 3.87–5.11)
RDW: 14.4 % (ref 11.5–15.5)
WBC: 14.9 10*3/uL — ABNORMAL HIGH (ref 4.0–10.5)
nRBC: 0 % (ref 0.0–0.2)

## 2020-04-03 LAB — ABO/RH: ABO/RH(D): A POS

## 2020-04-03 MED ORDER — IBUPROFEN 600 MG PO TABS
600.0000 mg | ORAL_TABLET | Freq: Four times a day (QID) | ORAL | Status: DC
  Administered 2020-04-03 – 2020-04-04 (×6): 600 mg via ORAL
  Filled 2020-04-03 (×5): qty 1

## 2020-04-03 MED ORDER — SENNOSIDES-DOCUSATE SODIUM 8.6-50 MG PO TABS
2.0000 | ORAL_TABLET | ORAL | Status: DC
  Administered 2020-04-04: 2 via ORAL
  Filled 2020-04-03: qty 2

## 2020-04-03 MED ORDER — PRENATAL MULTIVITAMIN CH
1.0000 | ORAL_TABLET | Freq: Every day | ORAL | Status: DC
  Administered 2020-04-03 – 2020-04-04 (×2): 1 via ORAL
  Filled 2020-04-03 (×2): qty 1

## 2020-04-03 MED ORDER — ONDANSETRON HCL 4 MG/2ML IJ SOLN: 4.0000 mg | INTRAMUSCULAR | Status: DC | PRN

## 2020-04-03 MED ORDER — OXYCODONE HCL 5 MG PO TABS: 10.0000 mg | ORAL_TABLET | ORAL | Status: DC | PRN

## 2020-04-03 MED ORDER — MAGNESIUM HYDROXIDE 400 MG/5ML PO SUSP: 30.0000 mL | ORAL | Status: DC | PRN

## 2020-04-03 MED ORDER — COCONUT OIL OIL: 1.0000 "application " | TOPICAL_OIL | Status: DC | PRN

## 2020-04-03 MED ORDER — OXYTOCIN 40 UNITS IN NORMAL SALINE INFUSION - SIMPLE MED
2.5000 [IU]/h | INTRAVENOUS | Status: DC
  Administered 2020-04-03: 39.96 [IU]/h via INTRAVENOUS

## 2020-04-03 MED ORDER — ONDANSETRON HCL 4 MG PO TABS: 4.0000 mg | ORAL_TABLET | ORAL | Status: DC | PRN

## 2020-04-03 MED ORDER — BENZOCAINE-MENTHOL 20-0.5 % EX AERO: 1.0000 "application " | INHALATION_SPRAY | CUTANEOUS | Status: DC | PRN

## 2020-04-03 MED ORDER — SIMETHICONE 80 MG PO CHEW: 80.0000 mg | CHEWABLE_TABLET | ORAL | Status: DC | PRN

## 2020-04-03 MED ORDER — OXYTOCIN 10 UNIT/ML IJ SOLN
10.0000 [IU] | Freq: Once | INTRAMUSCULAR | Status: AC
  Administered 2020-04-03: 10 [IU] via INTRAMUSCULAR

## 2020-04-03 MED ORDER — ACETAMINOPHEN 325 MG PO TABS: 650.0000 mg | ORAL_TABLET | ORAL | Status: DC | PRN

## 2020-04-03 MED ORDER — OXYCODONE HCL 5 MG PO TABS: 5.0000 mg | ORAL_TABLET | ORAL | Status: DC | PRN

## 2020-04-03 MED ORDER — DIPHENHYDRAMINE HCL 25 MG PO CAPS: 25.0000 mg | ORAL_CAPSULE | Freq: Four times a day (QID) | ORAL | Status: DC | PRN

## 2020-04-03 MED ORDER — DIBUCAINE (PERIANAL) 1 % EX OINT: 1.0000 "application " | TOPICAL_OINTMENT | CUTANEOUS | Status: DC | PRN

## 2020-04-03 MED ORDER — WITCH HAZEL-GLYCERIN EX PADS: 1.0000 "application " | MEDICATED_PAD | CUTANEOUS | Status: DC | PRN

## 2020-04-03 NOTE — Progress Notes (Signed)
Dr Dione Plover on unit and aware of pt's admission and status. At pt bedside for transport to BS.

## 2020-04-03 NOTE — Lactation Note (Signed)
This note was copied from a baby's chart. Lactation Consultation Note  Patient Name: Girl Imogen Magill [id number] Date: 04/03/2020  Baby girl Ryleigh now 63 hours old.  Mom reports she has been doing both breastfeeding and formula feeding.  Mom reports Ryleigh keeps sliding off and her nipples are a little sore.  Mom inquired as to why.  Discussed support pillows and hugging Ryleigh in close.   Mom reports her first would not latch but that she latches but just slides off.  Mom is a P2 vaginal delivery with Ryleigh. Ryleigh STS with dad right now.  Parents report it has been about 2 hours since she ate. No hunger cues.  Mom reports no breastfeeding education. Reviewed and left breastfeeding Consultation Services handout.  Urged to call for breastfeeding observation.    Maternal Data    Feeding Feeding Type: Bottle Fed - Formula  LATCH Score                   Interventions    Lactation Tools Discussed/Used     Consult Status      Laiza Veenstra Thompson Caul 04/03/2020, 6:26 PM

## 2020-04-03 NOTE — H&P (Signed)
LABOR AND DELIVERY ADMISSION HISTORY AND PHYSICAL NOTE  [name] is a 29 y.o. female G2P1001 with IUP at 72w5dby LMP c/w 19wk UKoreapresenting for SOL.  Patient presented fully dilated and +1 to MAU, rapidly transferred to L&D and had an uncomplicated precipitous NSVD.    She plans on breast and bottle feeding. Her contraception plan is: outpatient IUD.  [facility] P[facility] complications:  - Anemia - Hx of precipitous delivery  Past Medical History: Past Medical History:  Diagnosis Date  . Medical history non-contributory     Past Surgical History: Past Surgical History:  Procedure Laterality Date  . NO PAST SURGERIES      Obstetrical History: OB History    Gravida  2   Para  1   Term  1   Preterm      AB      Living  1     SAB      TAB      Ectopic      Multiple  0   Live Births  1           Social History: Social History   Socioeconomic History  . Marital status: Single    Spouse name: Not on file  . Number of children: Not on file  . Years of education: Not on file  . Highest education level: Not on file  Occupational History  . Not on file  Tobacco Use  . Smoking status: Never Smoker  . Smokeless tobacco: Never Used  Substance and Sexual Activity  . Alcohol use: No  . Drug use: No  . Sexual activity: Yes    Birth control/protection: None  Other Topics Concern  . Not on file  Social History Narrative  . Not on file   Social Determinants of Health   Financial Resource Strain:   . Difficulty of Paying Living Expenses:   Food Insecurity:   . Worried About RCharity fundraiserin the Last Year:   . RArboriculturistin the Last Year:   Transportation Needs:   . LFilm/video editor(Medical):   .Marland KitchenLack of Transportation (Non-Medical):   Physical Activity:   . Days of Exercise per Week:   . Minutes of Exercise per Session:   Stress:   . Feeling of Stress :   Social  Connections:   . Frequency of Communication with Friends and Family:   . Frequency of Social Gatherings with Friends and Family:   . Attends Religious Services:   . Active Member of Clubs or Organizations:   . Attends CArchivistMeetings:   .Marland KitchenMarital Status:     Family History: Family History  Problem Relation Age of Onset  . Diabetes Maternal Grandmother     Allergies: No Known Allergies  Medications Prior to Admission  Medication Sig Dispense Refill Last Dose  . AMBULATORY NON FORMULARY MEDICATION 1 Device by Other route once a week. Blood pressure cuff/Large  Monitored Regularly at home ICD 10 Z34.90 LROB 1 kit 0   . famotidine (PEPCID) 20 MG tablet Take 1 tablet (20 mg total) by mouth 2 (two) times daily. 60 tablet 3   . ferrous sulfate (FERROUSUL) 325 (65 FE) MG tablet Take 1 tablet (325 mg total) by mouth 2 (two) times daily. 60 tablet 1   . ondansetron (ZOFRAN ODT) 4 MG disintegrating tablet Take 1 tablet (4 mg total) by mouth every 6 (six) hours  as needed for nausea. 20 tablet 0   . Prenatal Multivit-Min-Fe-FA (PRENATAL VITAMINS) 0.8 MG tablet Take 1 tablet by mouth daily. 30 tablet 12      Review of Systems  All systems reviewed and negative except as stated in HPI  Physical Exam Temperature 98.1 F (36.7 C), temperature source Oral, last menstrual period 06/30/2019, unknown if currently breastfeeding. General appearance: alert, oriented, NAD Lungs: normal respiratory effort Heart: regular rate Abdomen: uterus firm at umbilicus Extremities: No calf swelling or tenderness     Prenatal labs: ABO, Rh: A/Positive/-- (12/15 1100) Antibody: Negative (12/15 1100) Rubella: 1.74 (12/15 1100) RPR: Non Reactive (02/05 0913)  HBsAg: Negative (12/15 1100)  HIV: Non Reactive (02/05 0913)  GC/Chlamydia: neg/neg 03/12/2020  GBS: Negative/-- (04/15 1153)  2-hr GTT: normal 01/03/2020 Genetic screening:  Not done Anatomy US: normal  Prenatal Transfer Tool   Maternal Diabetes: No Genetic Screening: Declined Maternal Ultrasounds/Referrals: Normal Fetal Ultrasounds or other Referrals:  None Maternal Substance Abuse:  No Significant Maternal Medications:  None Significant Maternal Lab Results: Group B Strep negative  No results found for this or any previous visit (from the past 24 hour(s)).  Patient Active Problem List   Diagnosis Date Noted  . History of precipitous delivery 11/12/2019  . Supervision of other normal pregnancy, antepartum 10/17/2019  . Anemia, antepartum 04/19/2018    Assessment: Mallory Harris is a 29 y.o. G2P1001 at 74w5dadmitted after precipitous NSVD.  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 Routine PP care.  #GBS/ID: neg #COVID: swab pending #MOF: Breast and bottle #MOC: outpatient IUD #Circ: n/a  MClarnce Flock5/05/2020, 2:41 AM

## 2020-04-03 NOTE — MAU Note (Signed)
Pt reports to MAU @0150  c/o feeling pressure like she had to push. Pt was brought back to a room and a cervial exam was performed patient was 10/100/0,+1. Pt stated she did not think her water was broken however she was leaking fluid prior to exam with bloody show. [id number] in MAU. Nikki RN spoke to [employer] RN for a bed on L&D. With the escort of Dr. Dione Plover we procceded to labor and delivery 217.

## 2020-04-03 NOTE — Discharge Summary (Signed)
Postpartum Discharge Summary    Patient Name: Mallory Harris DOB: 04-23-1991 MRN: [id number]  Date of admission: 04/03/2020 Delivering Provider: Clarnce Flock   Date of discharge: 04/04/2020  Admitting diagnosis: NSVD (normal spontaneous vaginal delivery) [O80] Intrauterine pregnancy: [redacted]w[redacted]d    Secondary diagnosis:  Active Problems:   Anemia, antepartum   Supervision of other normal pregnancy, antepartum   History of precipitous delivery   NSVD (normal spontaneous vaginal delivery)  Additional problems: None     Discharge diagnosis: Term Pregnancy Delivered                                                                                                Post partum procedures:None  Augmentation: none  Complications: None  Hospital course:  Onset of Labor With Vaginal Delivery     29y.o. yo G2P1001 at 335w5das admitted in Active Labor on 04/03/2020. Patient had an uncomplicated labor course as follows: arrived at full dilation and had precipitous NSVD. Membrane Rupture Time/Date: 12:00 AM ,04/03/2020   Intrapartum Procedures: Episiotomy: None [1]                                         Lacerations:  1st degree [2];Perineal [11]  Patient had a delivery of a Viable infant. 04/03/2020  Information for the patient's newborn:  MoKonni, Kesinger0[[id number]]Delivery Method: Vaginal, Spontaneous(Filed from Delivery Summary)     Pateint had an uncomplicated postpartum course.  She is ambulating, tolerating a regular diet, passing flatus, and urinating well. Patient is discharged home in stable condition on 04/04/20.  Delivery time: 2:01 AM    Magnesium Sulfate received: No BMZ received: No Rhophylac:N/A MMR:N/A Transfusion:No  Physical exam  Vitals:   04/03/20 0910 04/03/20 1223 04/03/20 2030 04/04/20 0603  BP: 119/68 115/83 109/71 121/74  Pulse: 64 85 66 81  Resp: 16 17 16 18   Temp: 98 F (36.7 C)  98.4 F (36.9 C) 98.1 F (36.7 C)  TempSrc: Oral  Oral Oral   SpO2: 100% 99% 99% 97%  Weight:      Height:       General: alert, cooperative and no distress Lochia: appropriate Uterine Fundus: firm Incision: N/A DVT Evaluation: No evidence of DVT seen on physical exam. No cords or calf tenderness. No significant calf/ankle edema. Labs: Lab Results  Component Value Date   WBC 14.9 (H) 04/03/2020   HGB 9.8 (L) 04/03/2020   HCT 31.6 (L) 04/03/2020   MCV 88.8 04/03/2020   PLT 197 04/03/2020   No flowsheet data found. Edinburgh Score: Edinburgh Postnatal Depression Scale Screening Tool 04/04/2020  I have been able to laugh and see the funny side of things. 0  I have looked forward with enjoyment to things. 0  I have blamed myself unnecessarily when things went wrong. 1  I have been anxious or worried for no good reason. 2  I have felt scared or panicky for no good reason. 0  Things have been getting on top  of me. 0  I have been so unhappy that I have had difficulty sleeping. 1  I have felt sad or miserable. 1  I have been so unhappy that I have been crying. 0  The thought of harming myself has occurred to me. 0  Edinburgh Postnatal Depression Scale Total 5    Discharge instruction: per After Visit Summary and "Baby and Me Booklet".  After visit meds:  Allergies as of 04/04/2020   No Known Allergies     Medication List    STOP taking these medications   ondansetron 4 MG disintegrating tablet Commonly known as: Zofran ODT     TAKE these medications   acetaminophen 325 MG tablet Commonly known as: Tylenol Take 2 tablets (650 mg total) by mouth every 4 (four) hours as needed (for pain scale < 4).   AMBULATORY NON FORMULARY MEDICATION 1 Device by Other route once a week. Blood pressure cuff/Large  Monitored Regularly at home ICD 10 Z34.90 LROB   famotidine 20 MG tablet Commonly known as: PEPCID Take 1 tablet (20 mg total) by mouth 2 (two) times daily.   ferrous sulfate 325 (65 FE) MG tablet Commonly known as: FerrouSul Take  1 tablet (325 mg total) by mouth 2 (two) times daily.   ibuprofen 600 MG tablet Commonly known as: ADVIL Take 1 tablet (600 mg total) by mouth every 6 (six) hours.   Prenatal Vitamins 0.8 MG tablet Take 1 tablet by mouth daily.   senna-docusate 8.6-50 MG tablet Commonly known as: Senokot-S Take 2 tablets by mouth daily. Start taking on: Apr 05, 2020       Diet: routine diet  Activity: Advance as tolerated. Pelvic rest for 6 weeks.   Outpatient follow up:6 weeks Follow up Appt: Future Appointments  Date Time Provider [place]  05/15/2020 10:30 AM Truett Mainland, DO [facility] None   Follow up Visit: [place] Follow up in 4 week(s).   Specialty: Obstetrics and Gynecology Why: They will call you with an appointment  Contact information: [place] [id number] [phone]           Please schedule this patient for Postpartum visit in: 6 weeks with the following provider: Any provider In-Person For C/S patients schedule nurse incision check in weeks 2 weeks: no Low risk pregnancy complicated by: n/a Delivery mode:  SVD Anticipated Birth Control:  IUD PP Procedures needed: IUD insertion  Schedule Integrated [place] visit: no   Newborn Data: Live born female  Birth Weight:  3175 g APGAR: 56, 9  Newborn Delivery   Birth date/time: 04/03/2020 02:01:00 Delivery type: Vaginal, Spontaneous      Baby Feeding: Bottle and Breast Disposition:home with mother   04/04/2020 Kerry Hough, PA-C

## 2020-04-04 MED ORDER — SENNOSIDES-DOCUSATE SODIUM 8.6-50 MG PO TABS: 2.0000 | ORAL_TABLET | ORAL | 0 refills | Status: DC

## 2020-04-04 MED ORDER — IBUPROFEN 600 MG PO TABS: 600.0000 mg | ORAL_TABLET | Freq: Four times a day (QID) | ORAL | 0 refills | Status: DC

## 2020-04-04 MED ORDER — ACETAMINOPHEN 325 MG PO TABS: 650.0000 mg | ORAL_TABLET | ORAL | 0 refills | Status: DC | PRN

## 2020-04-04 NOTE — Lactation Note (Signed)
This note was copied from a baby's chart. Lactation Consultation Note  Patient Name: Mallory Harris [id number] Date: 04/04/2020 Reason for consult: Follow-up assessment Baby 44hrs old. Dad sitting at bedside, mom resting in bed holding baby, baby asleep and swaddled. Mom reports last feeding ~8a, baby received formula d/t inability to latch to the breast, reports last attempt to latch to the breast occurred at ~5am. Encouraged to get baby down to diaper and skin to skin with mom. Baby alert and showing hunger cues. Mom hand expressed drops of colostrum, LC finger fed to baby to assess suck. Assisted with latching to left breast football hold, audible swallows noted with stimulation. Baby released breast on own ~70mins. Mom pulled baby up to chest to burp. Mom latched baby to football hold right breast without assistance, audible swallows noted, baby alert at the breast looking at mom. Reviewed cue based feedings, hormones of breastfeeding and effect on mom, engorgement and how to manage, skin to skin, support groups, and [facility] Breastfeeding brochure mom to call for [facility] phone support or outpatient consultation as needed. Mom reports comfort with plan and ability to latch baby on own, mo without further concerns. Left the room with baby still latched to right breast at 86min mark. BGilliam, RN, IBCLC  Maternal Data    Feeding Feeding Type: Breast Fed  LATCH Score Latch: Grasps breast easily, tongue down, lips flanged, rhythmical sucking.  Audible Swallowing: A few with stimulation  Type of Nipple: Everted at rest and after stimulation  Comfort (Breast/Nipple): Filling, red/small blisters or bruises, mild/mod discomfort  Hold (Positioning): Assistance needed to correctly position infant at breast and maintain latch.  LATCH Score: 7  Interventions Interventions: Breast feeding basics reviewed;Assisted with latch;Skin to skin;Breast massage;Hand express;Breast compression;Adjust position;Support  pillows;Position options;Expressed milk;Comfort gels  Lactation Tools Discussed/Used WIC Program: Yes   Consult Status Consult Status: Follow-up Date: 04/05/20 Follow-up type: In-patient    Bernita Buffy 04/04/2020, 12:01 PM

## 2020-04-04 NOTE — Discharge Instructions (Signed)

## 2020-04-04 NOTE — Progress Notes (Signed)
Post Partum Day 1 Subjective: no complaints, up ad lib, voiding and tolerating PO  Patient reports no acute events overnight.   Objective: Blood pressure 121/74, pulse 81, temperature 98.1 F (36.7 C), temperature source Oral, resp. rate 18, height 5\' 2"  (1.575 m), weight 103.9 kg, last menstrual period 06/30/2019, SpO2 97 %, unknown if currently breastfeeding.  Physical Exam:  General: alert and cooperative Lochia: appropriate Uterine Fundus: firm Incision: N/A DVT Evaluation: No evidence of DVT seen on physical exam. No cords or calf tenderness. No significant calf/ankle edema.  Recent Labs    04/03/20 0314  HGB 9.8*  HCT 31.6*    Assessment/Plan: Plan for discharge tomorrow, Breastfeeding and Contraception consider IUD at [facility] visit    LOS: 1 day   Mallory Harris 04/04/2020, 10:25 AM

## 2020-04-09 ENCOUNTER — Encounter: Payer: Medicaid Other | Admitting: Family Medicine

## 2020-05-15 ENCOUNTER — Ambulatory Visit: Payer: Medicaid Other | Admitting: Family Medicine

## 2020-05-20 ENCOUNTER — Ambulatory Visit (INDEPENDENT_AMBULATORY_CARE_PROVIDER_SITE_OTHER): Payer: Medicaid Other | Admitting: Family Medicine

## 2020-05-20 ENCOUNTER — Other Ambulatory Visit: Payer: Self-pay

## 2020-05-20 DIAGNOSIS — Z30430 Encounter for insertion of intrauterine contraceptive device: Secondary | ICD-10-CM | POA: Diagnosis not present

## 2020-05-20 MED ORDER — LEVONORGESTREL 19.5 MCG/DAY IU IUD
INTRAUTERINE_SYSTEM | Freq: Once | INTRAUTERINE | Status: AC
  Administered 2020-05-20: 1 via INTRAUTERINE

## 2020-05-20 NOTE — Patient Instructions (Signed)

## 2020-05-20 NOTE — Progress Notes (Signed)
    [place] Partum Visit Note  Mallory Harris is a 29 y.o. G75P2002 female who presents for a postpartum visit. She is 6 weeks postpartum following a normal spontaneous vaginal delivery.  I have fully reviewed the prenatal and intrapartum course. The delivery was at 79 gestational weeks.  Anesthesia: none. Postpartum course has been good. Baby is doing well. Baby is feeding by bottle - [employer] Good Start. Bleeding no bleeding. Bowel function is normal. Bladder function is normal. Patient is not sexually active. Contraception method is IUD. Postpartum depression screening: negative.  The following portions of the patient's history were reviewed and updated as appropriate: allergies, current medications, past family history, past medical history, past social history, past surgical history and problem list.  Review of Systems Pertinent items are noted in HPI.    Objective:  Blood pressure 111/76, pulse 63, height 5\' 2"  (1.575 m), weight 205 lb (93 kg), last menstrual period 06/30/2019, unknown if currently breastfeeding.  General:  alert, cooperative and no distress  Lungs: clear to auscultation bilaterally  Heart:  regular rate and rhythm, S1, S2 normal, no murmur, click, rub or gallop  Abdomen: soft, non-tender; bowel sounds normal; no masses,  no organomegaly   Vulva:  normal  Vagina: normal vagina, no discharge, exudate, lesion, or erythema  Cervix:  multiparous appearance  Corpus: normal size, contour, position, consistency, mobility, non-tender  Adnexa:  normal adnexa   IUD Procedure Note Patient identified, informed consent performed, signed copy in chart, time out was performed.  Urine pregnancy test negative.  Speculum placed in the vagina.  Cervix visualized.  Cleaned with Betadine x 2.  Grasped anteriorly with a single tooth tenaculum.  Uterus sounded to 9 cm.  Liletta  IUD placed per manufacturer's recommendations.  Strings trimmed to 3 cm. Tenaculum was removed, good hemostasis  noted.  Patient tolerated procedure well.   Patient given post procedure instructions and Liletta care card with expiration date.  Patient is asked to check IUD strings periodically and follow up in 4-6 weeks for IUD check.        Assessment:    Normal postpartum exam. Pap smear not done at today's visit.   Plan:   Essential components of care per ACOG recommendations:  1.  Mood and well being: Patient with negative depression screening today. Reviewed local resources for support.  - Patient does not use tobacco.  2. Infant care and feeding:  -Patient currently breastmilk feeding? No  -Social determinants of health (SDOH) reviewed in EPIC. No concerns  3. Sexuality, contraception and birth spacing - Patient does not want a pregnancy in the next year.  - Reviewed forms of contraception in tiered fashion. Patient desired IUD today.   - Discussed birth spacing of 18 months  4. Sleep and fatigue -Encouraged family/partner/community support of 4 hrs of uninterrupted sleep to help with mood and fatigue  5. Physical Recovery  - Discussed patients delivery - Patient had a 2nd degree laceration, perineal healing reviewed. Patient expressed understanding - Patient has urinary incontinence? No  - Patient is safe to resume physical and sexual activity  6.  [place] for [employer], [place]

## 2020-06-17 ENCOUNTER — Ambulatory Visit: Payer: Medicaid Other | Admitting: Obstetrics & Gynecology

## 2021-01-11 DIAGNOSIS — Z03818 Encounter for observation for suspected exposure to other biological agents ruled out: Secondary | ICD-10-CM | POA: Diagnosis not present

## 2021-01-11 DIAGNOSIS — Z20822 Contact with and (suspected) exposure to covid-19: Secondary | ICD-10-CM | POA: Diagnosis not present

## 2021-06-02 IMAGING — US US MFM OB TRANSVAGINAL
1 series · 13 of 28 positions shown · non-contrast
Comparison: none

[Series 1: us mfm ob transvaginal · 13 of 69 slices shown]
[im 3/69]
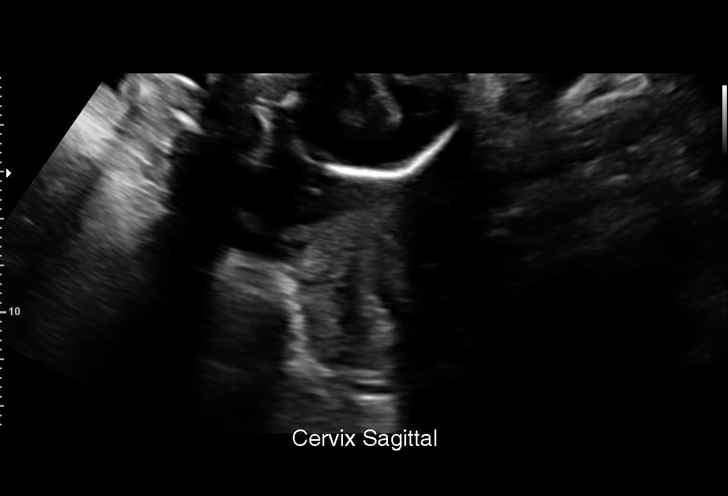
[im 8/69]
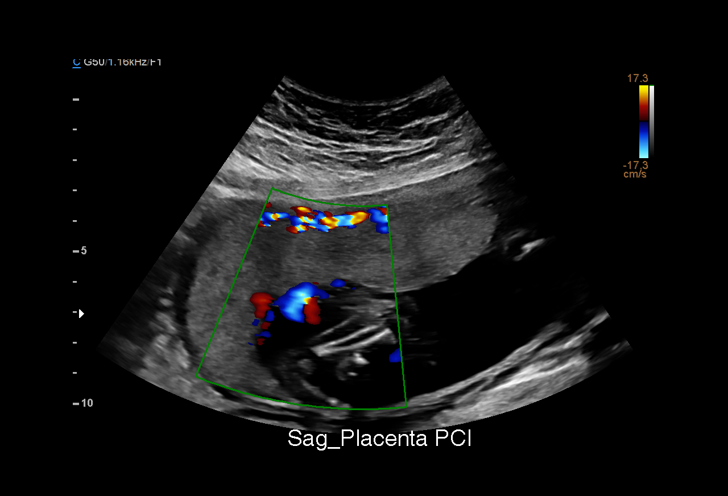
[im 13/69]
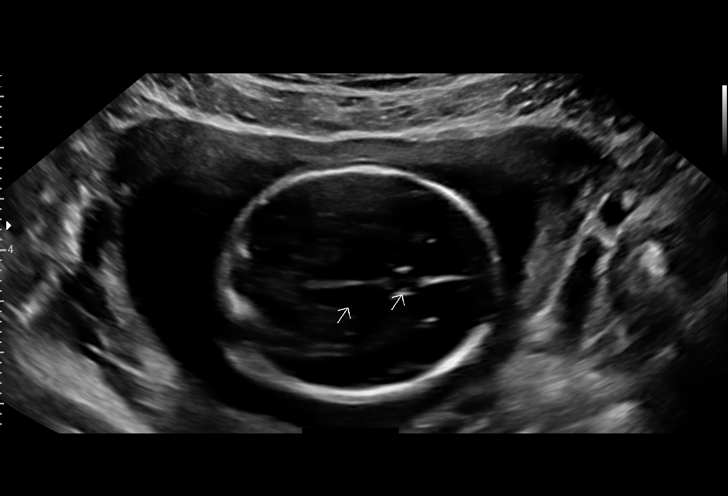
[im 18/69]
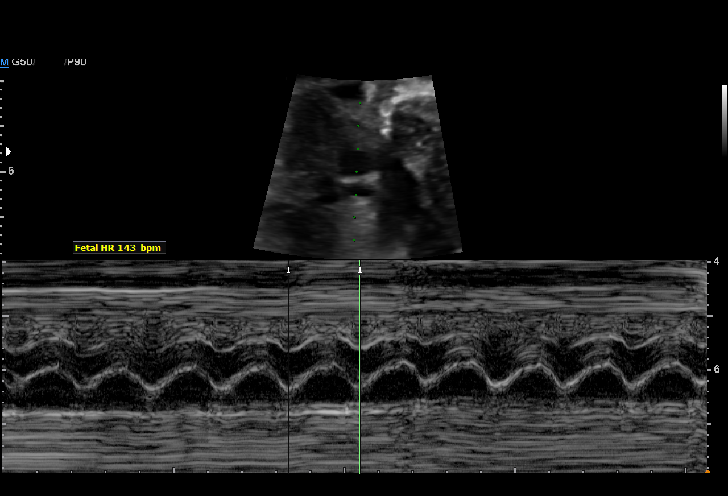
[im 23/69]
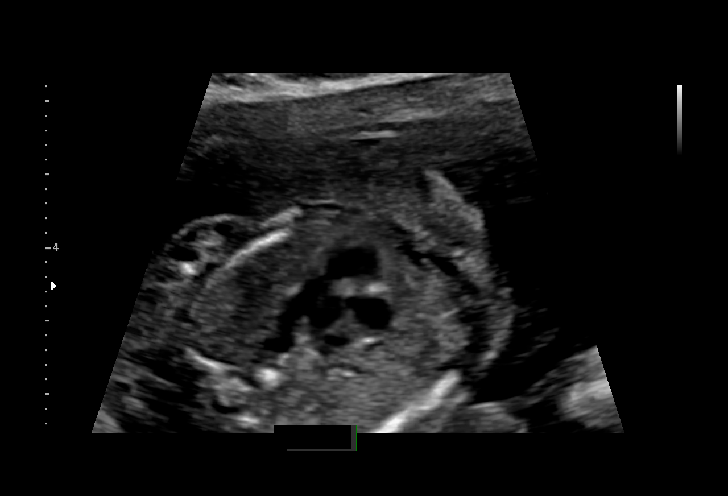
[im 28/69]
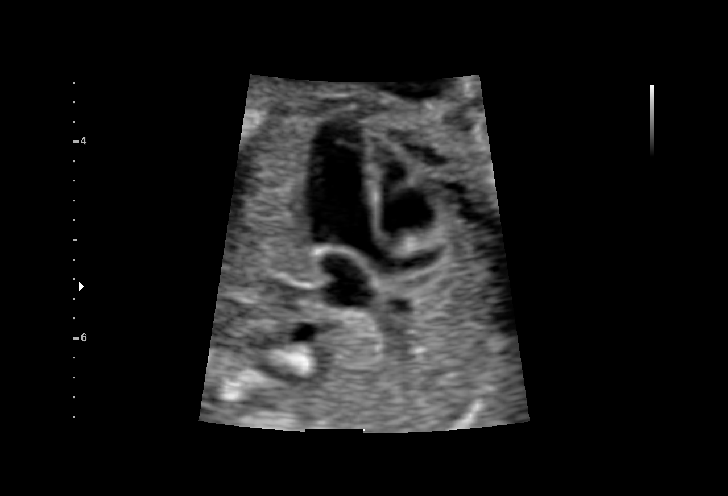
[im 36/69]
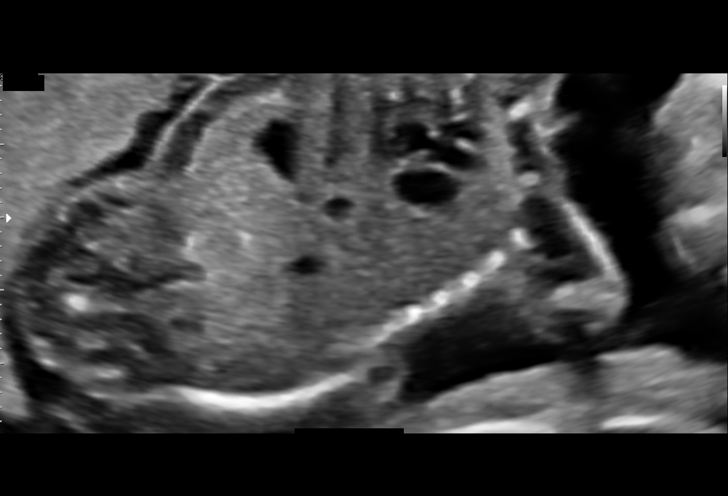
[im 41/69]
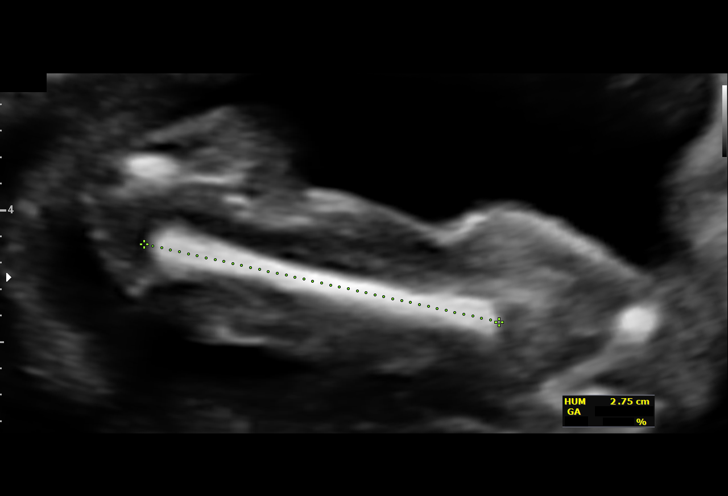
[im 46/69]
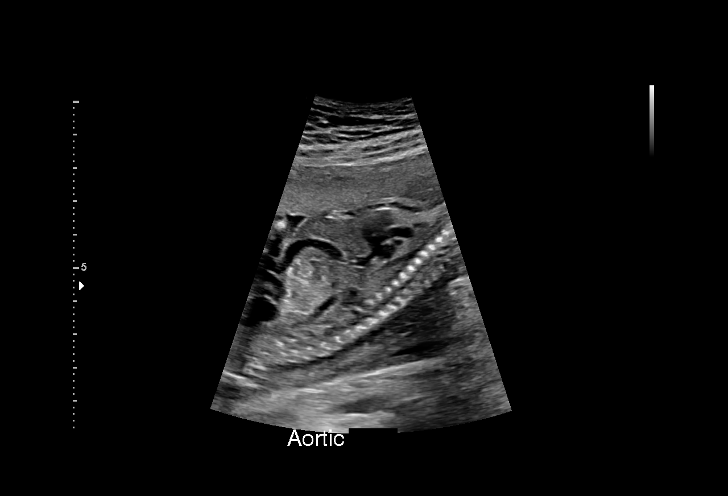
[im 51/69]
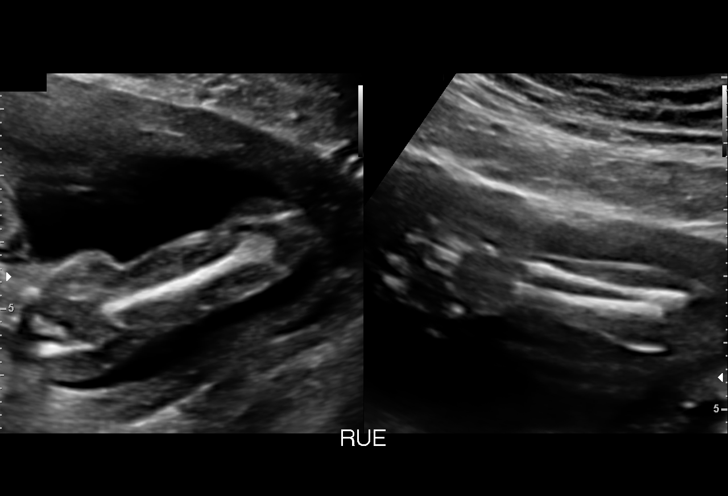
[im 56/69]
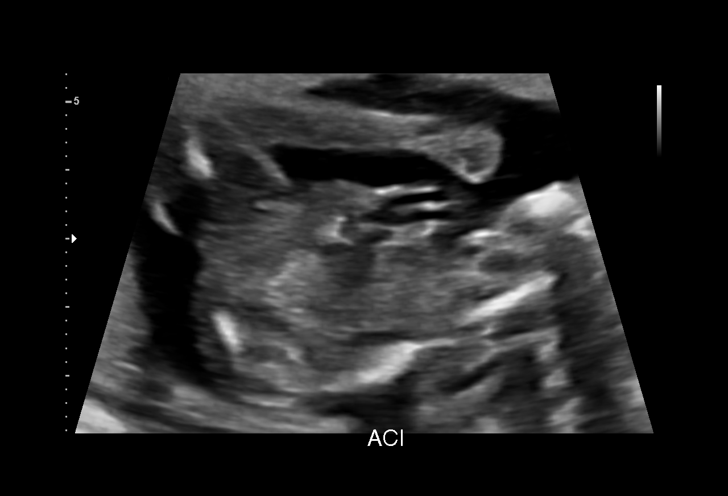
[im 61/69]
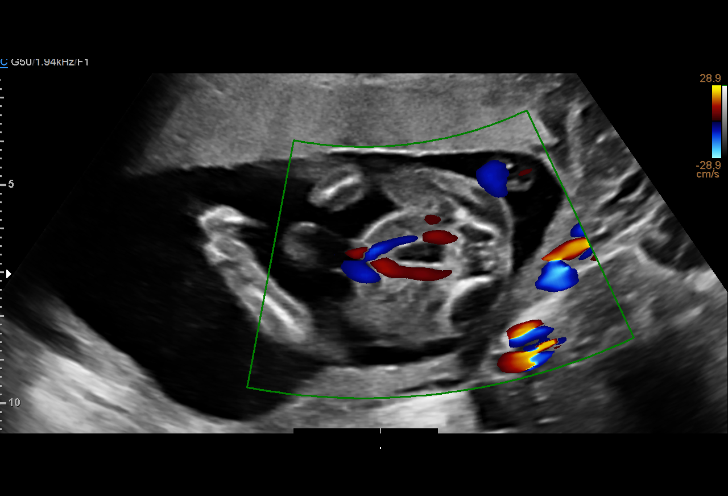
[im 66/69]
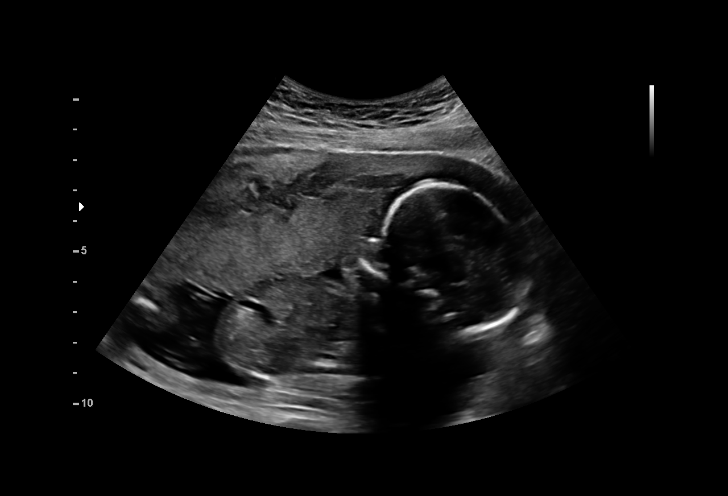

[13 of 28 positions shown; findings below may reference images not displayed]

[place]

                                                       ISLAMIN
                                                       ISLAMIN
 ----------------------------------------------------------------------

 ----------------------------------------------------------------------
Indications

  Anemia during pregnancy in second trimester
  Encounter for antenatal screening for
  malformations
  19 weeks gestation of pregnancy
 ----------------------------------------------------------------------
Fetal Evaluation

 Num Of Fetuses:         1
 Fetal Heart Rate(bpm):  143
 Cardiac Activity:       Observed
 Presentation:           Cephalic
 Placenta:               Anterior Fundal
 P. Cord Insertion:      Visualized

 Amniotic Fluid
 AFI FV:      Within normal limits

                             Largest Pocket(cm)

Biometry

 BPD:      42.5  mm     G. Age:  18w 6d         32  %    CI:        74.82   %    70 - 86
                                                         FL/HC:      18.2   %    16.1 -
 HC:      155.9  mm     G. Age:  18w 4d         12  %    HC/AC:      1.11        1.09 -
 AC:      140.5  mm     G. Age:  19w 3d         50  %    FL/BPD:     66.6   %
 FL:       28.3  mm     G. Age:  18w 5d         22  %    FL/AC:      20.1   %    20 - 24
 HUM:      27.4  mm     G. Age:  18w 5d         37  %
 CER:      21.6  mm     G. Age:  20w 4d         76  %
 NFT:      4.53  mm

 LV:        4.6  mm
 CM:        4.3  mm

 Est. FW:     269  gm      0 lb 9 oz     30  %
OB History

 Gravidity:    2         Term:   1
 Living:       1
Gestational Age

 LMP:           19w 2d        Date:  06/30/19                 EDD:   04/05/20
 U/S Today:     18w 6d                                        EDD:   04/08/20
 Best:          19w 2d     Det. By:  LMP  (06/30/19)          EDD:   04/05/20
Anatomy

 Cranium:               Appears normal         Aortic Arch:            Appears normal
 Cavum:                 Appears normal         Ductal Arch:            Appears normal
 Ventricles:            Appears normal         Diaphragm:              Appears normal
 Choroid Plexus:        Appears normal         Stomach:                Appears normal, left
                                                                       sided
 Cerebellum:            Appears normal         Abdomen:                Appears normal
 Posterior Fossa:       Appears normal         Abdominal Wall:         Appears nml (cord
                                                                       insert, abd wall)
 Nuchal Fold:           Not well visualized    Cord Vessels:           Appears normal (3
                                                                       vessel cord)
 Face:                  Appears normal         Kidneys:                Not well visualized
                        (orbits and profile)
 Lips:                  Appears normal         Bladder:                Appears normal
 Thoracic:              Appears normal         Spine:                  Not well visualized
                        Appears normal
 Heart:                 Appears normal         Upper Extremities:      Appears normal
                        (4CH, axis, and
                        situs)
 RVOT:                  Appears normal         Lower Extremities:      Appears normal
 LVOT:                  Appears normal

 Other:  Nasal bone visualized. Female gender Technically difficult due to fetal
         position.
Cervix Uterus Adnexa

 Cervix
 Length:           2.87  cm.
 Normal appearance by transabdominal scan.
Impression

 Normal anatomy
 Suboptimal views of the fetal anatomy obtained secondary to
 fetal position.
Recommendations

 Follow up growth in 4 weeks to complete the fetal anatomy

## 2021-06-30 IMAGING — US US MFM OB FOLLOW-UP
1 series · 13 of 28 positions shown · non-contrast
Comparison: none

[Series 1: us mfm ob follow-up · 13 of 60 slices shown]
[im 3/60]
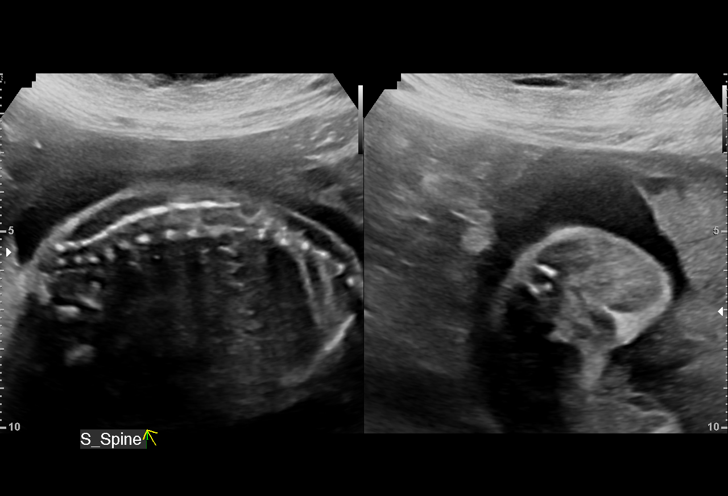
[im 7/60]
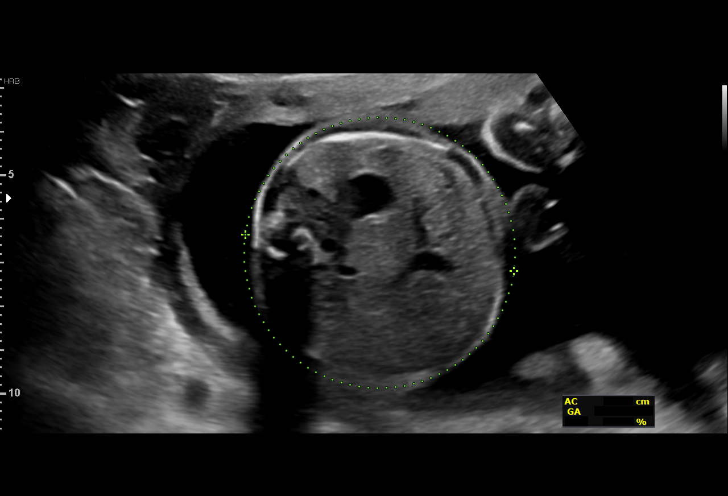
[im 11/60]
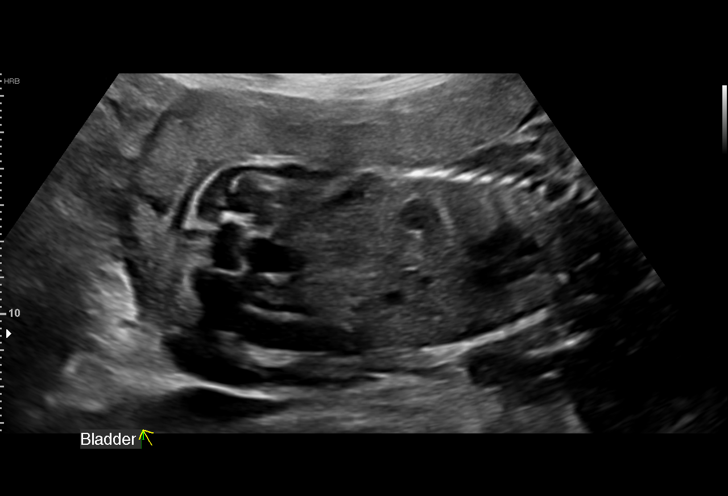
[im 16/60]
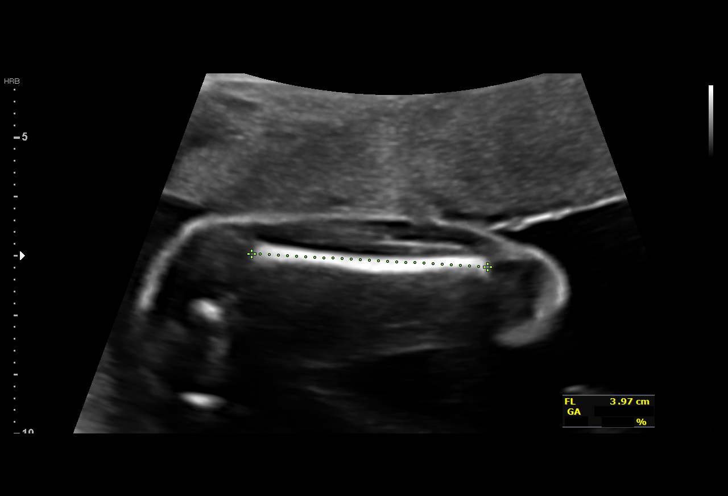
[im 20/60]
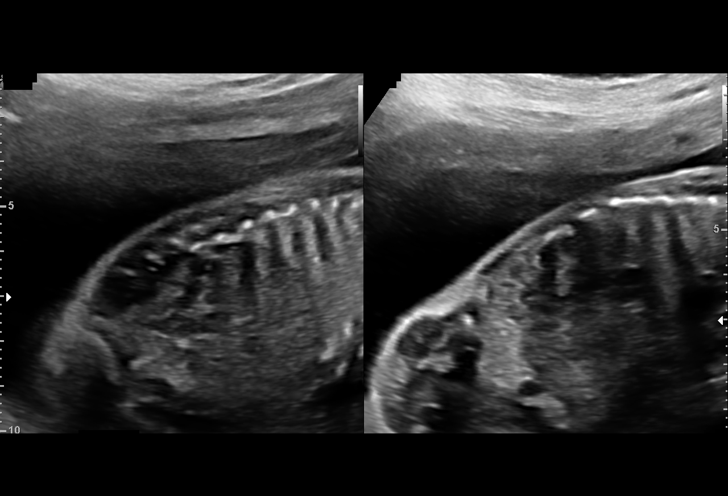
[im 25/60]
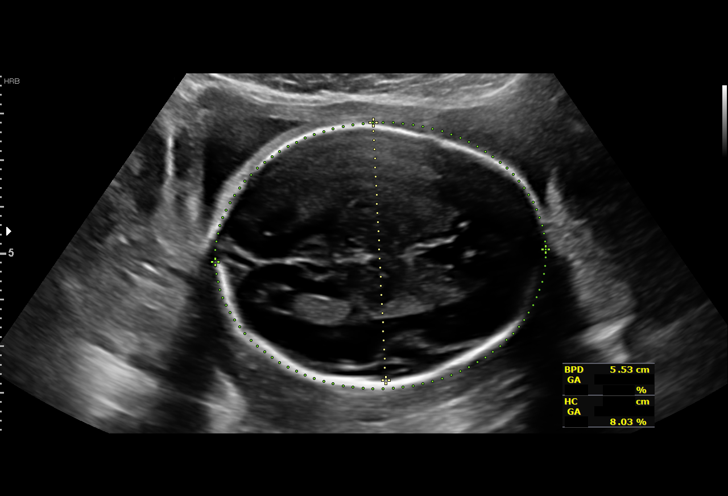
[im 31/60]
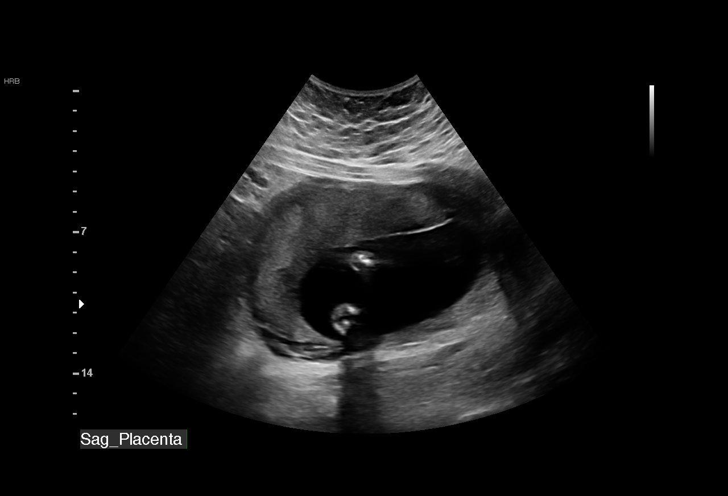
[im 35/60]
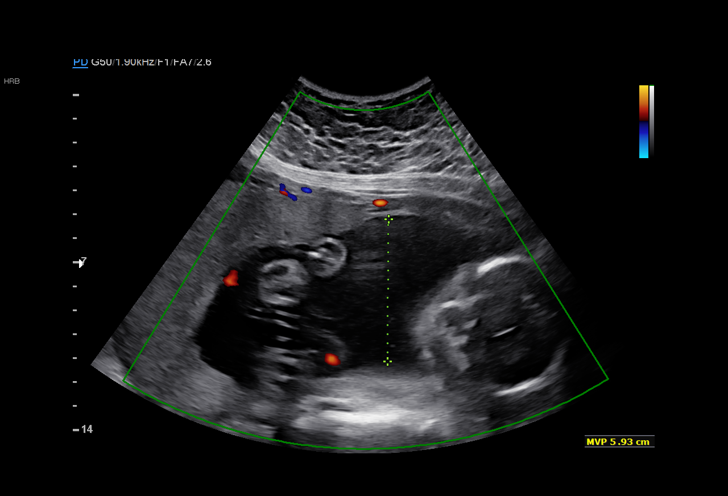
[im 40/60]
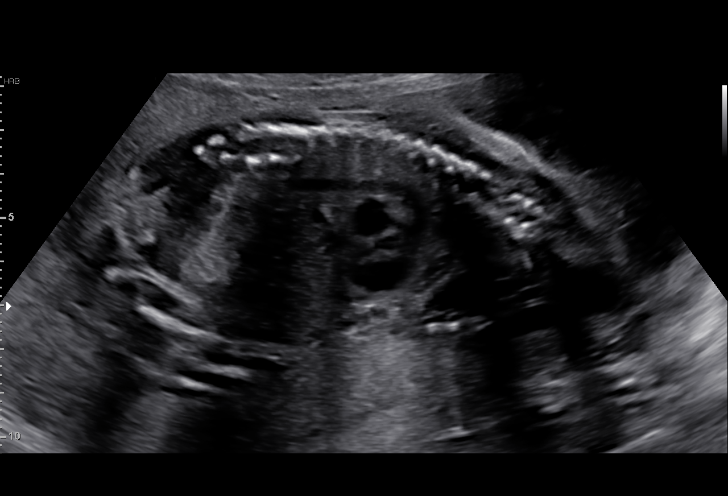
[im 44/60]
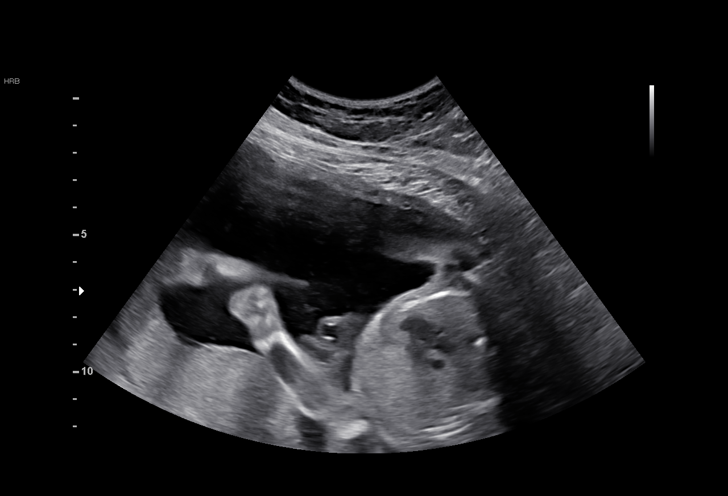
[im 49/60]
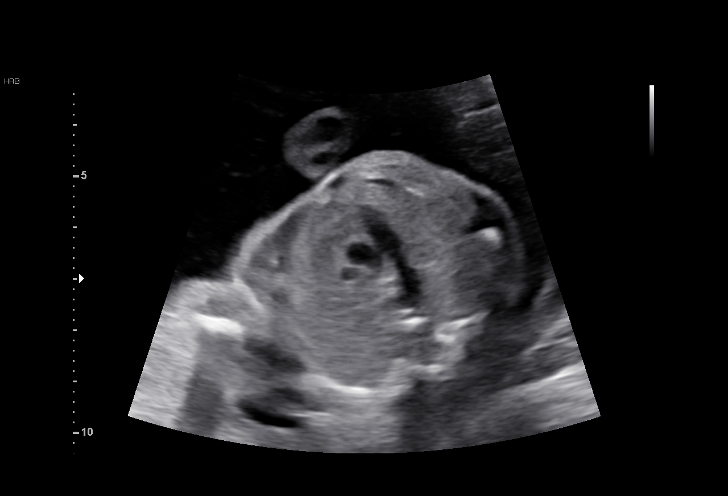
[im 53/60]
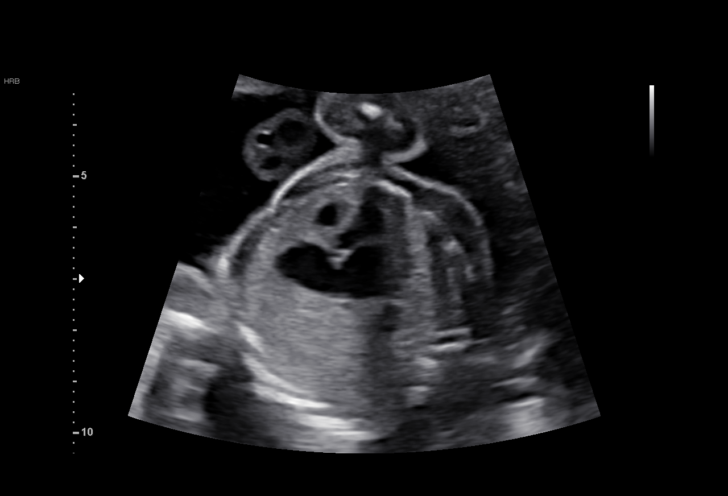
[im 57/60]
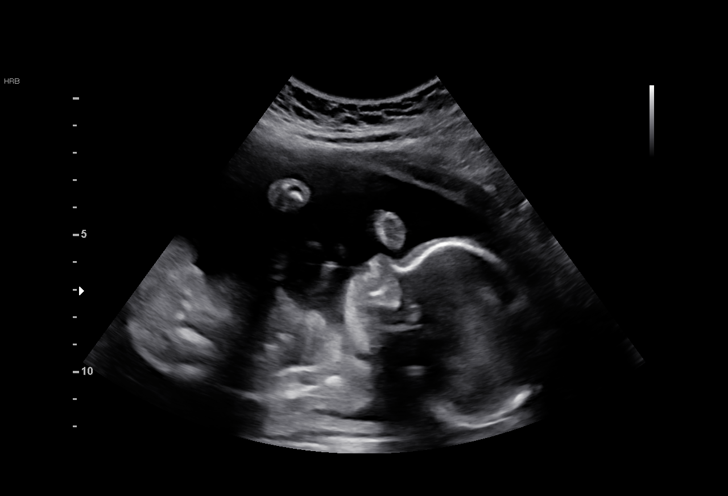

[13 of 28 positions shown; findings below may reference images not displayed]

[facility]

                                                       MICKENS
 ----------------------------------------------------------------------

 ----------------------------------------------------------------------
Indications

  Obesity complicating pregnancy, second
  trimester (BMI 39)
  Anemia during pregnancy in second trimester
  23 weeks gestation of pregnancy
 ----------------------------------------------------------------------
Vital Signs

 BMI:
Fetal Evaluation

 Num Of Fetuses:         1
 Cardiac Activity:       Observed
 Presentation:           Cephalic
 Placenta:               Anterior Fundal

 Amniotic Fluid
 AFI FV:      Within normal limits

                             Largest Pocket(cm)

Biometry

 BPD:      55.1  mm     G. Age:  22w 6d         27  %    CI:        74.64   %    70 - 86
                                                         FL/HC:      19.8   %    19.2 -
 HC:      202.4  mm     G. Age:  22w 3d          8  %    HC/AC:      1.04        1.05 -
 AC:      194.5  mm     G. Age:  24w 1d         69  %    FL/BPD:     72.8   %    71 - 87
 FL:       40.1  mm     G. Age:  23w 0d         27  %    FL/AC:      20.6   %    20 - 24
 HUM:        36  mm     G. Age:  22w 4d         25  %
 LV:        4.1  mm

 Est. FW:     597  gm      1 lb 5 oz     51  %
OB History

 Gravidity:    2         Term:   1
 Living:       1
Gestational Age

 LMP:           23w 2d        Date:  06/30/19                 EDD:   04/05/20
 U/S Today:     23w 1d                                        EDD:   04/06/20
 Best:          23w 2d     Det. By:  LMP  (06/30/19)          EDD:   04/05/20
Anatomy

 Cranium:               Appears normal         Aortic Arch:            Previously seen
 Cavum:                 Appears normal         Ductal Arch:            Previously seen
 Ventricles:            Appears normal         Diaphragm:              Appears normal
 Choroid Plexus:        Previously seen        Stomach:                Appears normal, left
                                                                       sided
 Cerebellum:            Previously seen        Abdomen:                Appears normal
 Posterior Fossa:       Previously seen        Abdominal Wall:         Appears nml (cord
                                                                       insert, abd wall)
 Nuchal Fold:           Not applicable (>20    Cord Vessels:           Previously seen
                        wks GA)
 Face:                  Orbits nl; profile not Kidneys:                Appear normal
                        well visualized
 Lips:                  Appears normal         Bladder:                Appears normal
 Thoracic:              Appears normal         Spine:                  Appears normal
                        Appears normal
 Heart:                 Previously seen        Upper Extremities:      Previously seen
 RVOT:                  Previously seen        Lower Extremities:      Previously seen
 LVOT:                  Previously seen

 Other:  Nasal bone visualized. Female gender Technically difficult due to fetal
         position.
Cervix Uterus Adnexa

 Cervix
 Not visualized (advanced GA >05wks)
Comments

 This patient was seen for a follow up exam as the fetal
 cardiac views and views of the profile were unable to be fully
 visualized during her prior exam.  She denies any problems
 since her last exam.
 She was informed that the fetal growth and amniotic fluid
 level appears appropriate for her gestational age.
 The views of the fetal heart and profile were visualized today.
 There were no obvious anomalies suspected.  The limitations
 of ultrasound in the detection of all anomalies was discussed.

 Follow-up as indicated.

## 2022-01-17 ENCOUNTER — Ambulatory Visit: Payer: Medicaid Other | Admitting: Family Medicine

## 2022-01-17 ENCOUNTER — Encounter: Payer: Self-pay | Admitting: Family Medicine

## 2022-01-17 ENCOUNTER — Other Ambulatory Visit: Payer: Self-pay

## 2022-01-17 ENCOUNTER — Other Ambulatory Visit (HOSPITAL_COMMUNITY)
Admission: RE | Admit: 2022-01-17 | Discharge: 2022-01-17 | Disposition: A | Discharge status: Home / Self Care | Payer: Medicaid Other | Source: Ambulatory Visit | Attending: Family Medicine | Admitting: Family Medicine

## 2022-01-17 VITALS — BP 128/79 | HR 76 | Ht 62.0 in | Wt 240.0 lb

## 2022-01-17 DIAGNOSIS — Z124 Encounter for screening for malignant neoplasm of cervix: Secondary | ICD-10-CM | POA: Insufficient documentation

## 2022-01-17 DIAGNOSIS — Z30432 Encounter for removal of intrauterine contraceptive device: Secondary | ICD-10-CM | POA: Diagnosis not present

## 2022-01-17 DIAGNOSIS — Z30011 Encounter for initial prescription of contraceptive pills: Secondary | ICD-10-CM

## 2022-01-17 DIAGNOSIS — Z01419 Encounter for gynecological examination (general) (routine) without abnormal findings: Secondary | ICD-10-CM | POA: Diagnosis not present

## 2022-01-17 MED ORDER — NORETHIN ACE-ETH ESTRAD-FE 1-20 MG-MCG(24) PO TABS: 1.0000 | ORAL_TABLET | Freq: Every day | ORAL | 3 refills | Status: DC

## 2022-01-17 NOTE — Progress Notes (Signed)
Subjective:     Mallory Harris is a 31 y.o. female and is here for a comprehensive physical exam. The patient reports problems - with IUD, in place since last gave birth . Weight gain, nausea.      The following portions of the patient's history were reviewed and updated as appropriate: allergies, current medications, past family history, past medical history, past social history, past surgical history, and problem list.  Review of Systems Pertinent items noted in HPI and remainder of comprehensive ROS otherwise negative.   Objective:    BP 128/79    Pulse 76    Ht 5\' 2"  (1.575 m)    Wt 240 lb (108.9 kg)    Breastfeeding No    BMI 43.90 kg/m  General appearance: alert, cooperative, and appears stated age Head: Normocephalic, without obvious abnormality, atraumatic Neck: no adenopathy, supple, symmetrical, trachea midline, thyroid not enlarged, symmetric, no tenderness/mass/nodules, and acanthosis nigricans Lungs: clear to auscultation bilaterally Breasts: normal appearance, no masses or tenderness Heart: regular rate and rhythm, S1, S2 normal, no murmur, click, rub or gallop Abdomen: soft, non-tender; bowel sounds normal; no masses,  no organomegaly Pelvic: cervix normal in appearance, external genitalia normal, no adnexal masses or tenderness, no cervical motion tenderness, uterus normal size, shape, and consistency, vagina normal without discharge, and IUD strings noted Extremities: Homans sign is negative, no sign of DVT Pulses: 2+ and symmetric Skin: Skin color, texture, turgor normal. No rashes or lesions Lymph nodes: Cervical, supraclavicular, and axillary nodes normal. Neurologic: Grossly normal     Procedure: Speculum placed inside vagina.  Cervix visualized.  Strings grasped with ring forceps.  IUD removed intact.  Assessment:    Healthy female exam.      Plan:     Problem List Items Addressed This Visit   None Visit Diagnoses     Screening for malignant  neoplasm of cervix    -  Primary   Relevant Orders   Cytology - PAP( [place])   Encounter for gynecological examination without abnormal finding       Encounter for initial prescription of contraceptive pills       rx for birth control pills--considering Cu IUD--will return for this.   Relevant Medications   Norethindrone Acetate-Ethinyl Estrad-FE (LOESTRIN 24 FE) 1-20 MG-MCG(24) tablet   Encounter for IUD removal           See After Visit Summary for Counseling Recommendations

## 2022-01-17 NOTE — Progress Notes (Signed)
Last pap- 04/17/18- negative

## 2022-01-17 NOTE — Patient Instructions (Signed)
Preventive Care 21-31 Years Old, Female °Preventive care refers to lifestyle choices and visits with your health care provider that can promote health and wellness. Preventive care visits are also called wellness exams. °What can I expect for my preventive care visit? °Counseling °During your preventive care visit, your health care provider may ask about your: °Medical history, including: °Past medical problems. °Family medical history. °Pregnancy history. °Current health, including: °Menstrual cycle. °Method of birth control. °Emotional well-being. °Home life and relationship well-being. °Sexual activity and sexual health. °Lifestyle, including: °Alcohol, nicotine or tobacco, and drug use. °Access to firearms. °Diet, exercise, and sleep habits. °Work and work environment. °Sunscreen use. °Safety issues such as seatbelt and bike helmet use. °Physical exam °Your health care provider may check your: °Height and weight. These may be used to calculate your BMI (body mass index). BMI is a measurement that tells if you are at a healthy weight. °Waist circumference. This measures the distance around your waistline. This measurement also tells if you are at a healthy weight and may help predict your risk of certain diseases, such as type 2 diabetes and high blood pressure. °Heart rate and blood pressure. °Body temperature. °Skin for abnormal spots. °What immunizations do I need? °Vaccines are usually given at various ages, according to a schedule. Your health care provider will recommend vaccines for you based on your age, medical history, and lifestyle or other factors, such as travel or where you work. °What tests do I need? °Screening °Your health care provider may recommend screening tests for certain conditions. This may include: °Pelvic exam and Pap test. °Lipid and cholesterol levels. °Diabetes screening. This is done by checking your blood sugar (glucose) after you have not eaten for a while (fasting). °Hepatitis B  test. °Hepatitis C test. °HIV (human immunodeficiency virus) test. °STI (sexually transmitted infection) testing, if you are at risk. °BRCA-related cancer screening. This may be done if you have a family history of breast, ovarian, tubal, or peritoneal cancers. °Talk with your health care provider about your test results, treatment options, and if necessary, the need for more tests. °Follow these instructions at home: °Eating and drinking ° °Eat a healthy diet that includes fresh fruits and vegetables, whole grains, lean protein, and low-fat dairy products. °Take vitamin and mineral supplements as recommended by your health care provider. °Do not drink alcohol if: °Your health care provider tells you not to drink. °You are pregnant, may be pregnant, or are planning to become pregnant. °If you drink alcohol: °Limit how much you have to 0-1 drink a day. °Know how much alcohol is in your drink. In the [place] one drink equals one 12 oz bottle of beer (355 mL), one 5 oz glass of wine (148 mL), or one 1½ oz glass of hard liquor (44 mL). °Lifestyle °Brush your teeth every morning and night with fluoride toothpaste. Floss one time each day. °Exercise for at least 30 minutes 5 or more days each week. °Do not use any products that contain nicotine or tobacco. These products include cigarettes, chewing tobacco, and vaping devices, such as e-cigarettes. If you need help quitting, ask your health care provider. °Do not use drugs. °If you are sexually active, practice safe sex. Use a condom or other form of protection to prevent STIs. °If you do not wish to become pregnant, use a form of birth control. If you plan to become pregnant, see your health care provider for a prepregnancy visit. °Find healthy ways to manage stress, such as: °Meditation, yoga,   or listening to music. °Journaling. °Talking to a trusted person. °Spending time with friends and family. °Minimize exposure to UV radiation to reduce your risk of skin  cancer. °Safety °Always wear your seat belt while driving or riding in a vehicle. °Do not drive: °If you have been drinking alcohol. Do not ride with someone who has been drinking. °If you have been using any mind-altering substances or drugs. °While texting. °When you are tired or distracted. °Wear a helmet and other protective equipment during sports activities. °If you have firearms in your house, make sure you follow all gun safety procedures. °Seek help if you have been physically or sexually abused. °What's next? °Go to your health care provider once a year for an annual wellness visit. °Ask your health care provider how often you should have your eyes and teeth checked. °Stay up to date on all vaccines. °This information is not intended to replace advice given to you by your health care provider. Make sure you discuss any questions you have with your health care provider. °Document Revised: 05/12/2021 Document Reviewed: 05/12/2021 °Elsevier Patient Education © 2022 [employer] ° °

## 2022-01-20 LAB — CYTOLOGY - PAP
Comment: NEGATIVE
Diagnosis: UNDETERMINED — AB
High risk HPV: NEGATIVE

## 2022-02-18 ENCOUNTER — Ambulatory Visit: Payer: Medicaid Other | Admitting: Obstetrics and Gynecology

## 2023-10-16 ENCOUNTER — Other Ambulatory Visit: Payer: Self-pay

## 2023-10-16 DIAGNOSIS — Z3490 Encounter for supervision of normal pregnancy, unspecified, unspecified trimester: Secondary | ICD-10-CM

## 2023-10-20 ENCOUNTER — Encounter (HOSPITAL_BASED_OUTPATIENT_CLINIC_OR_DEPARTMENT_OTHER): Payer: Self-pay | Admitting: Radiology

## 2023-10-20 ENCOUNTER — Ambulatory Visit (HOSPITAL_BASED_OUTPATIENT_CLINIC_OR_DEPARTMENT_OTHER)
Admission: RE | Admit: 2023-10-20 | Discharge: 2023-10-20 | Disposition: A | Discharge status: Home / Self Care | Payer: Self-pay | Source: Ambulatory Visit | Attending: Family Medicine | Admitting: Family Medicine

## 2023-10-20 DIAGNOSIS — Z3490 Encounter for supervision of normal pregnancy, unspecified, unspecified trimester: Secondary | ICD-10-CM | POA: Insufficient documentation

## 2023-11-13 ENCOUNTER — Telehealth: Payer: Medicaid Other

## 2023-11-20 ENCOUNTER — Telehealth: Payer: Medicaid Other

## 2023-11-20 DIAGNOSIS — Z3480 Encounter for supervision of other normal pregnancy, unspecified trimester: Secondary | ICD-10-CM | POA: Insufficient documentation

## 2023-11-20 DIAGNOSIS — O0990 Supervision of high risk pregnancy, unspecified, unspecified trimester: Secondary | ICD-10-CM | POA: Insufficient documentation

## 2023-11-20 DIAGNOSIS — Z3A15 15 weeks gestation of pregnancy: Secondary | ICD-10-CM

## 2023-11-20 DIAGNOSIS — Z3482 Encounter for supervision of other normal pregnancy, second trimester: Secondary | ICD-10-CM

## 2023-11-20 DIAGNOSIS — R112 Nausea with vomiting, unspecified: Secondary | ICD-10-CM

## 2023-11-20 MED ORDER — PROMETHAZINE HCL 25 MG PO TABS: 25.0000 mg | ORAL_TABLET | Freq: Four times a day (QID) | ORAL | 0 refills | Status: DC | PRN

## 2023-11-20 NOTE — Progress Notes (Signed)
New OB Intake  I connected with Mallory Harris  on 11/20/23 at  3:00 PM EST by MyChart Video Visit and verified that I am speaking with the correct person using two identifiers. Nurse is located at [facility] and pt is located at [address] [place] [address].  I discussed the limitations, risks, security and privacy concerns of performing an evaluation and management service by telephone and the availability of in person appointments. I also discussed with the patient that there may be a patient responsible charge related to this service. The patient expressed understanding and agreed to proceed.  I explained I am completing New OB Intake today. We discussed EDD of Not found.. Pt is G3P2002. I reviewed her allergies, medications and Medical/Surgical/OB history.    Patient Active Problem List   Diagnosis Date Noted   Anemia, antepartum 04/19/2018    Concerns addressed today  Delivery Plans Plans to deliver at [facility]. Discussed the nature of our practice with multiple providers including residents and students. Due to the size of the practice, the delivering provider may not be the same as those providing prenatal care.   Patient is not interested in water birth. Offered upcoming OB visit with CNM to discuss further.  MyChart/Babyscripts MyChart access verified. I explained pt will have some visits in office and some virtually. Babyscripts instructions given and order placed. Patient verifies receipt of registration text/e-mail. Account successfully created and app downloaded. If patient is a candidate for Optimized scheduling, add to sticky note.   Blood Pressure Cuff/Weight Scale  Explained after first prenatal appt pt will check weekly and document in Babyscripts.   Anatomy US Explained first scheduled [place] will be around 19 weeks. Anatomy US will be scheduled at [facility] appointment.   Is patient a CenteringPregnancy candidate?  Declined Declined due to  declined to  say.     Is patient a Mom+Baby Combined Care candidate?  Declined   If accepted, confirm patient does not intend to move from the area for at least 12 months, then notify Mom+Baby staff  Interested in [place]? If yes, send referral and doula dot phrase.   Is patient a candidate for Babyscripts Optimization? Yes, patient declined   First visit review I reviewed new OB appt with patient. Explained pt will be seen by Dr. Candelaria Celeste, DO at first visit. Discussed [name] genetic screening with patient.  Panorama and Horizon.. Routine prenatal labs  will be drawn at NOB visit.     Last Pap Diagnosis  Date Value Ref Range Status  01/17/2022 (A)  Final   - Atypical squamous cells of undetermined significance (ASC-US)    [name] l [name], CMA 11/20/2023  3:01 PM

## 2023-11-29 NOTE — L&D Delivery Note (Addendum)
 OB/GYN Faculty Practice Delivery Note  Mallory Harris is a 33 y.o. G3P2002 s/p SVD at [redacted]w[redacted]d. She was admitted for active labor.   ROM: Ruptured during delivery with clear fluid GBS Status: Negative  Maximum Maternal Temperature: 98.2  Labor Progress: Arrived to MAU with contractions. Initial SVE 6/100/0. Upon arrival to L&D floor, patient progressed to complete 10/100/+2. Precipitously delivered, without augmentation.   Delivery Date/Time: 05/10/24 @ 0901 Delivery: Called to room and patient was complete and pushing. Head delivered LOA. No nuchal cord present. Shoulder and body delivered in usual fashion. Infant with spontaneous cry, placed on mother's abdomen, dried and stimulated. Cord clamped x 2 after 1-minute delay, and cut by mother of baby under direct supervision. Cord blood drawn. Placenta delivered spontaneously with gentle cord traction. Fundus firm with massage and Pitocin . Labia, perineum, vagina, and cervix were inspected. Small periurethral abrasion noted, no lacerations.   Placenta: complete, three vessel cord appreciated Complications: Precipitous delivery  Lacerations: N/a EBL: 133 mL Analgesia: N/a  Infant:  APGAR (1 MIN): 9  APGAR (5 MINS): 9    Carey Chapman, MD   I was present for the entire delivery of baby and placenta and inspection of perineum and agree with above.  Felipe Horton, Jerelyn Trimarco , CNM 05/10/2024 6:17 PM

## 2023-12-07 ENCOUNTER — Ambulatory Visit (INDEPENDENT_AMBULATORY_CARE_PROVIDER_SITE_OTHER): Payer: Medicaid Other | Admitting: Family Medicine

## 2023-12-07 ENCOUNTER — Other Ambulatory Visit (HOSPITAL_COMMUNITY)
Admission: RE | Admit: 2023-12-07 | Discharge: 2023-12-07 | Disposition: A | Discharge status: Home / Self Care | Payer: Self-pay | Source: Ambulatory Visit | Attending: Family Medicine | Admitting: Family Medicine

## 2023-12-07 VITALS — BP 112/51 | HR 74 | Wt 238.0 lb

## 2023-12-07 DIAGNOSIS — Z1339 Encounter for screening examination for other mental health and behavioral disorders: Secondary | ICD-10-CM

## 2023-12-07 DIAGNOSIS — Z3480 Encounter for supervision of other normal pregnancy, unspecified trimester: Secondary | ICD-10-CM | POA: Diagnosis not present

## 2023-12-07 DIAGNOSIS — Z3482 Encounter for supervision of other normal pregnancy, second trimester: Secondary | ICD-10-CM

## 2023-12-07 DIAGNOSIS — Z3A18 18 weeks gestation of pregnancy: Secondary | ICD-10-CM | POA: Diagnosis not present

## 2023-12-08 NOTE — Progress Notes (Signed)
 Subjective:  Mallory Harris is a [id number] [redacted]w[redacted]d being seen today for her first obstetrical visit.  Her obstetrical history is significant for  2 normal vaginal deliveries without complications . Patient does intend to breast feed. Pregnancy history fully reviewed.  Patient reports no complaints.  BP (!) 112/51   Pulse 74   Wt 238 lb (108 kg)   LMP 08/02/2023   BMI 43.53 kg/m   HISTORY: OB History  Gravida Para Term Preterm AB Living  3 2 2   2   SAB IAB Ectopic Multiple Live Births     0 2    # Outcome Date GA Lbr Len/2nd Weight Sex Type Anes PTL Lv  3 Current           2 Term 04/03/20 [redacted]w[redacted]d 01:50 / 00:11 7 lb (3.175 kg) F Vag-Spont None  LIV  1 Term 09/10/18 [redacted]w[redacted]d 01:30 / 00:37 6 lb 11.4 oz (3.045 kg) M Vag-Spont Local N LIV     Birth Comments: Earnie Pouch CNM     Complications: Newborn delivered after precipitous labor    Past Medical History:  Diagnosis Date   Medical history non-contributory     Past Surgical History:  Procedure Laterality Date   NO PAST SURGERIES      Family History  Problem Relation Age of Onset   Diabetes Maternal Grandmother      Exam  BP (!) 112/51   Pulse 74   Wt 238 lb (108 kg)   LMP 08/02/2023   BMI 43.53 kg/m   Chaperone present during exam  CONSTITUTIONAL: Well-developed, well-nourished female in no acute distress.  HENT:  Normocephalic, atraumatic, External right and left ear normal. Oropharynx is clear and moist EYES: Conjunctivae and EOM are normal. Pupils are equal, round, and reactive to light. No scleral icterus.  NECK: Normal range of motion, supple, no masses.  Normal thyroid .  CARDIOVASCULAR: Normal heart rate noted, regular rhythm RESPIRATORY: Clear to auscultation bilaterally. Effort and breath sounds normal, no problems with respiration noted. BREASTS: Symmetric in size. No masses, skin changes, nipple drainage, or lymphadenopathy. ABDOMEN: Soft, normal bowel sounds, no distention noted.  No tenderness, rebound  or guarding.  PELVIC: Normal appearing external genitalia; normal appearing vaginal mucosa and cervix. No abnormal discharge noted. Normal uterine size, no other palpable masses, no uterine or adnexal tenderness. MUSCULOSKELETAL: Normal range of motion. No tenderness.  No cyanosis, clubbing, or edema.  2+ distal pulses. SKIN: Skin is warm and dry. No rash noted. Not diaphoretic. No erythema. No pallor. NEUROLOGIC: Alert and oriented to person, place, and time. Normal reflexes, muscle tone coordination. No cranial nerve deficit noted. PSYCHIATRIC: Normal mood and affect. Normal behavior. Normal judgment and thought content.    Assessment:    Pregnancy: [id number] Patient Active Problem List   Diagnosis Date Noted   Supervision of other normal pregnancy, antepartum 11/20/2023   Anemia, antepartum 04/19/2018      Plan:   1. [redacted] weeks gestation of pregnancy (Primary)  2. Supervision of other normal pregnancy, antepartum FHT normal Check HgA1c Will need to start ASA 81mg  - US  MFM OB DETAIL +14 WK; Future - Culture, OB Urine - Cytology - PAP( [place])    Initial labs obtained Continue prenatal vitamins Reviewed n/v relief measures and warning s/s to report Reviewed recommended weight gain based on pre-gravid BMI Encouraged well-balanced diet Genetic & carrier screening discussed: requests Panorama,  Ultrasound discussed; fetal survey: requested CCNC completed> form faxed if has or is planning to apply for  medicaid The nature of [facility] [employer] with multiple MDs and other Advanced Practice Providers was explained to patient; also emphasized that fellows, residents, and students are part of our team.   Problem list reviewed and updated. 75% of 30 min visit spent on counseling and coordination of care.     Weylyn Ricciuti J Kou Gucciardo 12/08/2023

## 2023-12-09 LAB — CULTURE, OB URINE

## 2023-12-09 LAB — URINE CULTURE, OB REFLEX

## 2023-12-12 LAB — CYTOLOGY - PAP
Chlamydia: NEGATIVE
Comment: NEGATIVE
Comment: NEGATIVE
Comment: NORMAL
Diagnosis: REACTIVE
High risk HPV: NEGATIVE
Neisseria Gonorrhea: NEGATIVE

## 2023-12-14 ENCOUNTER — Other Ambulatory Visit: Payer: Self-pay

## 2023-12-20 DIAGNOSIS — O99210 Obesity complicating pregnancy, unspecified trimester: Secondary | ICD-10-CM | POA: Insufficient documentation

## 2023-12-28 ENCOUNTER — Other Ambulatory Visit: Payer: Self-pay

## 2023-12-28 ENCOUNTER — Ambulatory Visit: Payer: Medicaid Other | Attending: Family Medicine

## 2023-12-28 ENCOUNTER — Ambulatory Visit (HOSPITAL_BASED_OUTPATIENT_CLINIC_OR_DEPARTMENT_OTHER): Payer: Medicaid Other | Admitting: Maternal & Fetal Medicine

## 2023-12-28 ENCOUNTER — Ambulatory Visit: Payer: Medicaid Other

## 2023-12-28 DIAGNOSIS — O99210 Obesity complicating pregnancy, unspecified trimester: Secondary | ICD-10-CM | POA: Insufficient documentation

## 2023-12-28 DIAGNOSIS — Z3480 Encounter for supervision of other normal pregnancy, unspecified trimester: Secondary | ICD-10-CM | POA: Insufficient documentation

## 2023-12-28 DIAGNOSIS — O99012 Anemia complicating pregnancy, second trimester: Secondary | ICD-10-CM | POA: Diagnosis not present

## 2023-12-28 DIAGNOSIS — O99019 Anemia complicating pregnancy, unspecified trimester: Secondary | ICD-10-CM | POA: Insufficient documentation

## 2023-12-28 DIAGNOSIS — O99212 Obesity complicating pregnancy, second trimester: Secondary | ICD-10-CM | POA: Insufficient documentation

## 2023-12-28 DIAGNOSIS — Z363 Encounter for antenatal screening for malformations: Secondary | ICD-10-CM | POA: Insufficient documentation

## 2023-12-28 DIAGNOSIS — Z3A21 21 weeks gestation of pregnancy: Secondary | ICD-10-CM

## 2023-12-28 DIAGNOSIS — O321XX0 Maternal care for breech presentation, not applicable or unspecified: Secondary | ICD-10-CM | POA: Diagnosis not present

## 2023-12-28 NOTE — Progress Notes (Signed)
Patient information  Patient Name: Mallory Harris  Patient MRN:   [id number]  Referring practice: MFM Referring Provider: [place] [facility] ([facility]  MFM CONSULT  Mallory Harris is a 33 y.o. G3P2002 at [redacted]w[redacted]d here for ultrasound and consultation. Patient Active Problem List   Diagnosis Date Noted   Obesity affecting pregnancy, antepartum 12/20/2023   Supervision of other normal pregnancy, antepartum 11/20/2023   Anemia, antepartum 04/19/2018   Mallory Harris is doing well today with no acute concerns. She denies contractions, bleeding, or loss of fluid and reports good fetal movement.   RE elevated BMI: I discussed the diagnosis management and complications associate with obesity in pregnancy.  We discussed the need for limiting weight gain to approximately 10 to 20 pounds.    Counseling I discussed the potential adverse obstetric, fetal and neonatal associations with each specific pregnancy complication. I discussed the risk of fetal growth abnormalities and stillbirth based on her pregnancy risk factors. I discussed the role of antenatal testing, serial growth ultrasounds and the timing of delivery based on the clinical course. I discussed the risk and impact of preeclampsia on her pregnancy and the role of baseline laboratory assessment of kidney, liver and platelet count as well as the role of low dose Aspirin to the reduce the risk of developing preeclampsia. I reassured the patient that we expect a favorable pregnancy outcome but due to her pregnancy complications she will need a higher level of monitoring for her pregnancy compared to a pregnancy without complications. The patient had time to ask questions that were answered to her satisfaction. She verbalized understanding of our discussion and request to proceed with the plan outlined in the recommendations.   Sonographic findings Single intrauterine pregnancy at 21w 1d  Fetal cardiac activity:  Observed and appears  normal. Presentation: Breech. The anatomic structures that were well seen appear normal without evidence of soft markers. Due to poor acoustic windows some structures remain suboptimally visualized. Fetal biometry shows the estimated fetal weight at the 40 percentile.  Amniotic fluid: Within normal limits.  MVP: 4.83 cm. Placenta: Posterior. Adnexa: No abnormality visualized. Cervical length: 4.9 cm.  There are limitations of prenatal ultrasound such as the inability to detect certain abnormalities due to poor visualization. Various factors such as fetal position, gestational age and maternal body habitus may increase the difficulty in visualizing the fetal anatomy.    Recommendations -EDD is Estimated Date of Delivery: 05/08/24 based on LMP. -Aneuploidy screening declined. Aneuploidy cannot be detected on ultrasound alone. OB provider to discuss with patient at next prenatal visit.  -Detailed ultrasound was done today without abnormalities but was limited due to poor acoustic windows. -Baseline preeclampsia labs: CMP, CBC and urine protein/creatinine ratio if not previously completed.  -Early glucose screening due to multiple risk factors. -Aspirin 81 mg for preeclampsia prophylaxis -Follow-up anatomy and fetal growth in 4 to 6 weeks -Serial growth ultrasounds starting around 28 weeks to monitor for fetal growth restriction -Antenatal testing to start around 34 weeks due to the increased risk of stillbirth and high risk pregnancy -Delivery timing pending clinical course but likely around 39 weeks gestion -Continue routine prenatal care with referring OB provider  Review of Systems: A review of systems was performed and was negative except per HPI   Vitals and Physical Exam    12/28/2023   10:31 AM 12/07/2023    3:26 PM 01/17/2022    2:56 PM  Vitals with BMI  Height   5\' 2"   Weight  238 lbs 240 lbs  BMI   43.89  Systolic 127 112 045  Diastolic 70 51 79  Pulse  74 76    Sitting  comfortably on the sonogram table Nonlabored breathing Normal rate and rhythm Abdomen is nontender  Past pregnancies OB History  Gravida Para Term Preterm AB Living  3 2 2   2   SAB IAB Ectopic Multiple Live Births     0 2    # Outcome Date GA Lbr Len/2nd Weight Sex Type Anes PTL Lv  3 Current           2 Term 04/03/20 [redacted]w[redacted]d 01:50 / 00:11 7 lb (3.175 kg) F Vag-Spont None  LIV  1 Term 09/10/18 [redacted]w[redacted]d 01:30 / 00:37 6 lb 11.4 oz (3.045 kg) M Vag-Spont Local N LIV     Birth Comments: Mallory Harris CNM     Complications: Newborn delivered after precipitous labor    I spent 20 minutes reviewing the patients chart, including labs and images as well as counseling the patient about her medical conditions. Greater than 50% of the time was spent in direct face-to-face patient counseling.  Mallory Harris  MFM, [place]   12/28/2023  12:00 PM

## 2024-01-03 ENCOUNTER — Other Ambulatory Visit: Payer: Self-pay | Admitting: *Deleted

## 2024-01-03 DIAGNOSIS — Z362 Encounter for other antenatal screening follow-up: Secondary | ICD-10-CM

## 2024-01-03 DIAGNOSIS — O99212 Obesity complicating pregnancy, second trimester: Secondary | ICD-10-CM

## 2024-01-08 ENCOUNTER — Encounter: Payer: Self-pay | Admitting: Family Medicine

## 2024-01-11 ENCOUNTER — Encounter: Payer: Medicaid Other | Admitting: Family Medicine

## 2024-02-01 ENCOUNTER — Other Ambulatory Visit: Payer: Self-pay

## 2024-02-01 ENCOUNTER — Ambulatory Visit: Payer: Medicaid Other | Attending: Maternal & Fetal Medicine

## 2024-02-01 ENCOUNTER — Other Ambulatory Visit: Payer: Self-pay | Admitting: *Deleted

## 2024-02-01 VITALS — BP 132/64

## 2024-02-01 DIAGNOSIS — O99212 Obesity complicating pregnancy, second trimester: Secondary | ICD-10-CM

## 2024-02-01 DIAGNOSIS — E669 Obesity, unspecified: Secondary | ICD-10-CM

## 2024-02-01 DIAGNOSIS — Z3480 Encounter for supervision of other normal pregnancy, unspecified trimester: Secondary | ICD-10-CM | POA: Insufficient documentation

## 2024-02-01 DIAGNOSIS — Z362 Encounter for other antenatal screening follow-up: Secondary | ICD-10-CM | POA: Diagnosis not present

## 2024-02-01 DIAGNOSIS — Z3A26 26 weeks gestation of pregnancy: Secondary | ICD-10-CM | POA: Diagnosis not present

## 2024-02-01 NOTE — Progress Notes (Unsigned)
.  RSCONSULTNOTE

## 2024-03-01 ENCOUNTER — Ambulatory Visit

## 2024-03-29 ENCOUNTER — Ambulatory Visit: Attending: Obstetrics and Gynecology

## 2024-04-05 ENCOUNTER — Ambulatory Visit: Attending: Obstetrics and Gynecology

## 2024-04-15 ENCOUNTER — Encounter: Payer: Self-pay | Admitting: Obstetrics & Gynecology

## 2024-04-15 ENCOUNTER — Ambulatory Visit (INDEPENDENT_AMBULATORY_CARE_PROVIDER_SITE_OTHER): Admitting: Obstetrics & Gynecology

## 2024-04-15 ENCOUNTER — Other Ambulatory Visit (HOSPITAL_COMMUNITY)
Admission: RE | Admit: 2024-04-15 | Discharge: 2024-04-15 | Disposition: A | Discharge status: Home / Self Care | Source: Ambulatory Visit | Attending: Obstetrics & Gynecology | Admitting: Obstetrics & Gynecology

## 2024-04-15 VITALS — BP 122/64 | HR 78 | Wt 244.0 lb

## 2024-04-15 DIAGNOSIS — O23593 Infection of other part of genital tract in pregnancy, third trimester: Secondary | ICD-10-CM | POA: Diagnosis not present

## 2024-04-15 DIAGNOSIS — Z3A36 36 weeks gestation of pregnancy: Secondary | ICD-10-CM

## 2024-04-15 DIAGNOSIS — O99019 Anemia complicating pregnancy, unspecified trimester: Secondary | ICD-10-CM | POA: Diagnosis not present

## 2024-04-15 DIAGNOSIS — O0993 Supervision of high risk pregnancy, unspecified, third trimester: Secondary | ICD-10-CM | POA: Diagnosis not present

## 2024-04-15 DIAGNOSIS — O99210 Obesity complicating pregnancy, unspecified trimester: Secondary | ICD-10-CM

## 2024-04-15 DIAGNOSIS — O0930 Supervision of pregnancy with insufficient antenatal care, unspecified trimester: Secondary | ICD-10-CM

## 2024-04-15 NOTE — Progress Notes (Signed)
   PRENATAL VISIT NOTE  Subjective:  Mallory Harris is a 33 y.o. G3P2002 at [redacted]w[redacted]d being seen today for ongoing prenatal care.  She is currently monitored for the following issues for this high-risk pregnancy and has Anemia, antepartum; Supervision of high-risk pregnancy; Maternal morbid obesity, antepartum (HCC); and Insufficient prenatal care on their problem list.  Patient reports pelvic pressure, wants cervical check.  She has not been seen since her initial OB visit at 18 weeks, but had MFM ultrasounds.  Patient says she was unable to come due to work schedule.  Was supposed to be in weekly antenatal testing starting 34 weeks, also overdue for a growth scan.   Contractions: Not present. Vag. Bleeding: Scant (one time).  Movement: Present. Denies leaking of fluid.   The following portions of the patient's history were reviewed and updated as appropriate: allergies, current medications, past family history, past medical history, past social history, past surgical history and problem list.   Objective:    Vitals:   04/15/24 1507  BP: 122/64  Pulse: 78  Weight: 244 lb (110.7 kg)    Fetal Status:  Fetal Heart Rate (bpm): 147   Movement: Present Presentation: Vertex  General: Alert, oriented and cooperative. Patient is in no acute distress.  Skin: Skin is warm and dry. No rash noted.   Cardiovascular: Normal heart rate noted  Respiratory: Normal respiratory effort, no problems with respiration noted  Abdomen: Soft, gravid, appropriate for gestational age.  Pain/Pressure: Present     Pelvic: Cervical exam performed in the presence of a chaperone Dilation: 4 Effacement (%): 50 Station: -2. Cultures obtained.  Extremities: Normal range of motion.  Edema: Trace  Mental Status: Normal mood and affect. Normal behavior. Normal judgment and thought content.   Assessment and Plan:  Pregnancy: G3P2002 at [redacted]w[redacted]d 1. Maternal morbid obesity, antepartum (HCC) (Primary) PG BMI 43.5.  Will be  scheduled for weekly testing. NST to be done in office today. - US  MFM OB FOLLOW UP; Future - US  MFM FETAL BPP WO NON STRESS; Future - Fetal nonstress test  2. Insufficient antepartum care Emphasized importance of coming to prenatal visits, especially at this point in pregnancy.  3. [redacted] weeks gestation of pregnancy 4. Supervision of high risk pregnancy in third trimester Unable to do GTT today, needs rest of labs done - Cervicovaginal ancillary only - Strep Gp B NAA - CBC/D/Plt+RPR+Rh+ABO+RubIgG... - Hemoglobin A1c - Comprehensive metabolic panel with GFR - TSH Rfx on Abnormal to Free T4 Very favorable cervix on exam.  Labor symptoms and general obstetric precautions including but not limited to vaginal bleeding, contractions, leaking of fluid and fetal movement were reviewed in detail with the patient. Please refer to After Visit Summary for other counseling recommendations.   Return in about 1 week (around 04/22/2024) for OFFICE OB VISIT (MD only).  Future Appointments  Date Time Provider Department Center  04/25/2024  7:00 AM [facility] PROVIDER 1 [facility]  04/25/2024  7:30 AM [facility] US1 [facility]    Lenoard Rad, MD

## 2024-04-15 NOTE — Patient Instructions (Signed)

## 2024-04-17 LAB — COMPREHENSIVE METABOLIC PANEL WITH GFR
ALT: 12 IU/L (ref 0–32)
AST: 12 IU/L (ref 0–40)
Albumin: 3.2 g/dL — ABNORMAL LOW (ref 3.9–4.9)
Alkaline Phosphatase: 155 IU/L — ABNORMAL HIGH (ref 44–121)
BUN/Creatinine Ratio: 10 (ref 9–23)
BUN: 5 mg/dL — ABNORMAL LOW (ref 6–20)
Bilirubin Total: <0.2 mg/dL (ref 0.0–1.2)
CO2: 17 mmol/L — ABNORMAL LOW (ref 20–29)
Calcium: 8.5 mg/dL — ABNORMAL LOW (ref 8.7–10.2)
Chloride: 110 mmol/L — ABNORMAL HIGH (ref 96–106)
Creatinine, Ser: 0.5 mg/dL — ABNORMAL LOW (ref 0.57–1.00)
Globulin, Total: 2.8 g/dL (ref 1.5–4.5)
Glucose: 98 mg/dL (ref 70–99)
Potassium: 3.6 mmol/L (ref 3.5–5.2)
Sodium: 141 mmol/L (ref 134–144)
Total Protein: 6 g/dL (ref 6.0–8.5)
eGFR: 127 mL/min/{1.73_m2} (ref >59)

## 2024-04-17 LAB — CBC/D/PLT+RPR+RH+ABO+RUBIGG...
Basophils Absolute: 0 10*3/uL (ref 0.0–0.2)
Basos: 0 %
EOS (ABSOLUTE): 0 10*3/uL (ref 0.0–0.4)
Eos: 0 %
HCV Ab: NONREACTIVE
HIV Screen 4th Generation wRfx: NONREACTIVE
Hematocrit: 31.6 % — ABNORMAL LOW (ref 34.0–46.6)
Hemoglobin: 9.8 g/dL — ABNORMAL LOW (ref 11.1–15.9)
Hepatitis B Surface Ag: NEGATIVE
Immature Grans (Abs): 0 10*3/uL (ref 0.0–0.1)
Immature Granulocytes: 0 %
Lymphocytes Absolute: 1.2 10*3/uL (ref 0.7–3.1)
Lymphs: 24 %
MCH: 26.9 pg (ref 26.6–33.0)
MCHC: 31 g/dL — ABNORMAL LOW (ref 31.5–35.7)
MCV: 87 fL (ref 79–97)
Monocytes Absolute: 0.5 10*3/uL (ref 0.1–0.9)
Monocytes: 9 %
Neutrophils Absolute: 3.3 10*3/uL (ref 1.4–7.0)
Neutrophils: 67 %
Platelets: 156 10*3/uL (ref 150–450)
RBC: 3.64 x10E6/uL — ABNORMAL LOW (ref 3.77–5.28)
RDW: 13.4 % (ref 11.7–15.4)
RPR Ser Ql: NONREACTIVE
Rh Factor: POSITIVE
Rubella Antibodies, IGG: 1.71 {index} (ref >0.99)
WBC: 5 10*3/uL (ref 3.4–10.8)

## 2024-04-17 LAB — AB SCR+ANTIBODY ID

## 2024-04-17 LAB — CERVICOVAGINAL ANCILLARY ONLY
Bacterial Vaginitis (gardnerella): POSITIVE — AB
Candida Glabrata: NEGATIVE
Candida Vaginitis: POSITIVE — AB
Chlamydia: NEGATIVE
Comment: NEGATIVE
Comment: NEGATIVE
Comment: NEGATIVE
Comment: NEGATIVE
Comment: NEGATIVE
Comment: NORMAL
Neisseria Gonorrhea: NEGATIVE
Trichomonas: NEGATIVE

## 2024-04-17 LAB — TSH RFX ON ABNORMAL TO FREE T4: TSH: 1.61 u[IU]/mL (ref 0.450–4.500)

## 2024-04-17 LAB — STREP GP B NAA: Strep Gp B NAA: NEGATIVE

## 2024-04-17 LAB — HEMOGLOBIN A1C
Est. average glucose Bld gHb Est-mCnc: 97 mg/dL
Hgb A1c MFr Bld: 5 % (ref 4.8–5.6)

## 2024-04-17 LAB — HCV INTERPRETATION

## 2024-04-18 ENCOUNTER — Ambulatory Visit: Payer: Self-pay | Admitting: Obstetrics & Gynecology

## 2024-04-18 MED ORDER — FERRIC MALTOL 30 MG PO CAPS: 1.0000 | ORAL_CAPSULE | Freq: Two times a day (BID) | ORAL | 2 refills | Status: AC

## 2024-04-18 MED ORDER — FLUCONAZOLE 150 MG PO TABS: 150.0000 mg | ORAL_TABLET | Freq: Once | ORAL | 0 refills | Status: AC

## 2024-04-18 MED ORDER — METRONIDAZOLE 500 MG PO TABS: 500.0000 mg | ORAL_TABLET | Freq: Two times a day (BID) | ORAL | 0 refills | Status: AC

## 2024-04-18 MED ORDER — TERCONAZOLE 0.4 % VA CREA: 1.0000 | TOPICAL_CREAM | Freq: Every day | VAGINAL | 0 refills | Status: DC

## 2024-04-18 NOTE — Addendum Note (Signed)
 Addended by: Lenoard Rad A on: 04/18/2024 08:51 AM   Modules accepted: Orders

## 2024-04-18 NOTE — Progress Notes (Signed)
 Patient has anemia, yeast and bacterial vaginitis. Prescribed Accrufer, Diflucan and Metronidazole . Please call to inform patient of results and advise her to pick up prescriptions and take as directed. Also please call pharmacy to cancel Terconazole Rx, ordered this in error.  Thank you!    Lenoard Rad, MD

## 2024-04-18 NOTE — Telephone Encounter (Signed)
 Called patient to inform her of positive yeast and BV results. Left message for patient to call the office.  Called patient's pharmacy to cancel Terconazole order. Per [facility] pharmacy tech, Terconazole cream will be canceled. Zyrah Wiswell l Ryker Pherigo, CMA

## 2024-04-24 ENCOUNTER — Encounter: Admitting: Obstetrics & Gynecology

## 2024-04-25 ENCOUNTER — Ambulatory Visit

## 2024-05-09 ENCOUNTER — Encounter: Admitting: Obstetrics and Gynecology

## 2024-05-10 ENCOUNTER — Other Ambulatory Visit: Payer: Self-pay

## 2024-05-10 ENCOUNTER — Inpatient Hospital Stay (HOSPITAL_COMMUNITY)
Admission: AD | Admit: 2024-05-10 | Discharge: 2024-05-11 | DRG: 807 | Disposition: A | Discharge status: Home / Self Care | Attending: Obstetrics and Gynecology | Admitting: Obstetrics and Gynecology

## 2024-05-10 ENCOUNTER — Encounter (HOSPITAL_COMMUNITY): Payer: Self-pay | Admitting: Obstetrics & Gynecology

## 2024-05-10 DIAGNOSIS — Z833 Family history of diabetes mellitus: Secondary | ICD-10-CM | POA: Diagnosis not present

## 2024-05-10 DIAGNOSIS — O9902 Anemia complicating childbirth: Secondary | ICD-10-CM | POA: Diagnosis present

## 2024-05-10 DIAGNOSIS — O99214 Obesity complicating childbirth: Secondary | ICD-10-CM | POA: Diagnosis present

## 2024-05-10 DIAGNOSIS — O26893 Other specified pregnancy related conditions, third trimester: Secondary | ICD-10-CM | POA: Diagnosis not present

## 2024-05-10 DIAGNOSIS — O480 Post-term pregnancy: Secondary | ICD-10-CM | POA: Diagnosis not present

## 2024-05-10 DIAGNOSIS — O0933 Supervision of pregnancy with insufficient antenatal care, third trimester: Secondary | ICD-10-CM | POA: Diagnosis not present

## 2024-05-10 DIAGNOSIS — O99019 Anemia complicating pregnancy, unspecified trimester: Secondary | ICD-10-CM | POA: Diagnosis present

## 2024-05-10 DIAGNOSIS — Z3A40 40 weeks gestation of pregnancy: Secondary | ICD-10-CM | POA: Diagnosis not present

## 2024-05-10 DIAGNOSIS — O0930 Supervision of pregnancy with insufficient antenatal care, unspecified trimester: Secondary | ICD-10-CM

## 2024-05-10 LAB — COMPREHENSIVE METABOLIC PANEL WITH GFR
ALT: 8 U/L (ref 0–44)
AST: 14 U/L — ABNORMAL LOW (ref 15–41)
Albumin: 2.6 g/dL — ABNORMAL LOW (ref 3.5–5.0)
Alkaline Phosphatase: 192 U/L — ABNORMAL HIGH (ref 38–126)
Anion gap: 11 (ref 5–15)
BUN: <5 mg/dL — ABNORMAL LOW (ref 6–20)
CO2: 18 mmol/L — ABNORMAL LOW (ref 22–32)
Calcium: 8.6 mg/dL — ABNORMAL LOW (ref 8.9–10.3)
Chloride: 108 mmol/L (ref 98–111)
Creatinine, Ser: 0.59 mg/dL (ref 0.44–1.00)
GFR, Estimated: >60 mL/min (ref >60)
Glucose, Bld: 91 mg/dL (ref 70–99)
Potassium: 3.4 mmol/L — ABNORMAL LOW (ref 3.5–5.1)
Sodium: 137 mmol/L (ref 135–145)
Total Bilirubin: 0.4 mg/dL (ref 0.0–1.2)
Total Protein: 6.4 g/dL — ABNORMAL LOW (ref 6.5–8.1)

## 2024-05-10 LAB — CBC
HCT: 34 % — ABNORMAL LOW (ref 36.0–46.0)
Hemoglobin: 10.8 g/dL — ABNORMAL LOW (ref 12.0–15.0)
MCH: 27.6 pg (ref 26.0–34.0)
MCHC: 31.8 g/dL (ref 30.0–36.0)
MCV: 86.7 fL (ref 80.0–100.0)
Platelets: 145 10*3/uL — ABNORMAL LOW (ref 150–400)
RBC: 3.92 MIL/uL (ref 3.87–5.11)
RDW: 14.2 % (ref 11.5–15.5)
WBC: 6.3 10*3/uL (ref 4.0–10.5)
nRBC: 0 % (ref 0.0–0.2)

## 2024-05-10 LAB — TYPE AND SCREEN
ABO/RH(D): A POS
Antibody Screen: NEGATIVE

## 2024-05-10 LAB — RPR
RPR Ser Ql: REACTIVE — AB
RPR Titer: 1:1 {titer}

## 2024-05-10 MED ORDER — BENZOCAINE-MENTHOL 20-0.5 % EX AERO
1.0000 | INHALATION_SPRAY | CUTANEOUS | Status: DC | PRN
  Administered 2024-05-10: 1 via TOPICAL
  Filled 2024-05-10: qty 56

## 2024-05-10 MED ORDER — FENTANYL-BUPIVACAINE-NACL 0.5-0.125-0.9 MG/250ML-% EP SOLN
EPIDURAL | Status: AC
  Filled 2024-05-10: qty 250

## 2024-05-10 MED ORDER — DIBUCAINE (PERIANAL) 1 % EX OINT: 1.0000 | TOPICAL_OINTMENT | CUTANEOUS | Status: DC | PRN

## 2024-05-10 MED ORDER — ACETAMINOPHEN 325 MG PO TABS
650.0000 mg | ORAL_TABLET | ORAL | Status: DC | PRN
  Administered 2024-05-10 – 2024-05-11 (×2): 650 mg via ORAL
  Filled 2024-05-10 (×2): qty 2

## 2024-05-10 MED ORDER — LACTATED RINGERS IV SOLN: 500.0000 mL | Freq: Once | INTRAVENOUS | Status: DC

## 2024-05-10 MED ORDER — ACETAMINOPHEN 325 MG PO TABS: 650.0000 mg | ORAL_TABLET | ORAL | Status: DC | PRN

## 2024-05-10 MED ORDER — WITCH HAZEL-GLYCERIN EX PADS: 1.0000 | MEDICATED_PAD | CUTANEOUS | Status: DC | PRN

## 2024-05-10 MED ORDER — PRENATAL MULTIVITAMIN CH
1.0000 | ORAL_TABLET | Freq: Every day | ORAL | Status: DC
Start: 2024-05-10 — End: 2024-05-11
  Administered 2024-05-10 – 2024-05-11 (×2): 1 via ORAL
  Filled 2024-05-10 (×2): qty 1

## 2024-05-10 MED ORDER — FENTANYL-BUPIVACAINE-NACL 0.5-0.125-0.9 MG/250ML-% EP SOLN: 12.0000 mL/h | EPIDURAL | Status: DC | PRN

## 2024-05-10 MED ORDER — OXYTOCIN BOLUS FROM INFUSION
333.0000 mL | Freq: Once | INTRAVENOUS | Status: AC
  Administered 2024-05-10: 333 mL via INTRAVENOUS

## 2024-05-10 MED ORDER — LACTATED RINGERS IV SOLN: INTRAVENOUS | Status: DC

## 2024-05-10 MED ORDER — EPHEDRINE 5 MG/ML INJ: 10.0000 mg | INTRAVENOUS | Status: DC | PRN

## 2024-05-10 MED ORDER — COCONUT OIL OIL: 1.0000 | TOPICAL_OIL | Status: DC | PRN

## 2024-05-10 MED ORDER — ZOLPIDEM TARTRATE 5 MG PO TABS: 5.0000 mg | ORAL_TABLET | Freq: Every evening | ORAL | Status: DC | PRN

## 2024-05-10 MED ORDER — SIMETHICONE 80 MG PO CHEW: 80.0000 mg | CHEWABLE_TABLET | ORAL | Status: DC | PRN

## 2024-05-10 MED ORDER — PHENYLEPHRINE 80 MCG/ML (10ML) SYRINGE FOR IV PUSH (FOR BLOOD PRESSURE SUPPORT): 80.0000 ug | PREFILLED_SYRINGE | INTRAVENOUS | Status: DC | PRN

## 2024-05-10 MED ORDER — DIPHENHYDRAMINE HCL 25 MG PO CAPS: 25.0000 mg | ORAL_CAPSULE | Freq: Four times a day (QID) | ORAL | Status: DC | PRN

## 2024-05-10 MED ORDER — SOD CITRATE-CITRIC ACID 500-334 MG/5ML PO SOLN: 30.0000 mL | ORAL | Status: DC | PRN

## 2024-05-10 MED ORDER — OXYTOCIN-SODIUM CHLORIDE 30-0.9 UT/500ML-% IV SOLN
2.5000 [IU]/h | INTRAVENOUS | Status: DC
  Filled 2024-05-10: qty 500

## 2024-05-10 MED ORDER — OXYCODONE-ACETAMINOPHEN 5-325 MG PO TABS: 2.0000 | ORAL_TABLET | ORAL | Status: DC | PRN

## 2024-05-10 MED ORDER — LACTATED RINGERS IV SOLN: 500.0000 mL | INTRAVENOUS | Status: DC | PRN

## 2024-05-10 MED ORDER — LIDOCAINE HCL (PF) 1 % IJ SOLN: 30.0000 mL | INTRAMUSCULAR | Status: DC | PRN

## 2024-05-10 MED ORDER — SENNOSIDES-DOCUSATE SODIUM 8.6-50 MG PO TABS
2.0000 | ORAL_TABLET | Freq: Every day | ORAL | Status: DC
  Administered 2024-05-11: 2 via ORAL
  Filled 2024-05-10: qty 2

## 2024-05-10 MED ORDER — ONDANSETRON HCL 4 MG/2ML IJ SOLN: 4.0000 mg | INTRAMUSCULAR | Status: DC | PRN

## 2024-05-10 MED ORDER — TETANUS-DIPHTH-ACELL PERTUSSIS 5-2.5-18.5 LF-MCG/0.5 IM SUSY: 0.5000 mL | PREFILLED_SYRINGE | Freq: Once | INTRAMUSCULAR | Status: DC

## 2024-05-10 MED ORDER — ONDANSETRON HCL 4 MG PO TABS: 4.0000 mg | ORAL_TABLET | ORAL | Status: DC | PRN

## 2024-05-10 MED ORDER — IBUPROFEN 600 MG PO TABS
600.0000 mg | ORAL_TABLET | Freq: Four times a day (QID) | ORAL | Status: DC
  Administered 2024-05-10 – 2024-05-11 (×5): 600 mg via ORAL
  Filled 2024-05-10 (×5): qty 1

## 2024-05-10 MED ORDER — FENTANYL CITRATE (PF) 100 MCG/2ML IJ SOLN: 50.0000 ug | INTRAMUSCULAR | Status: DC | PRN

## 2024-05-10 MED ORDER — FLEET ENEMA RE ENEM: 1.0000 | ENEMA | RECTAL | Status: DC | PRN

## 2024-05-10 MED ORDER — ONDANSETRON HCL 4 MG/2ML IJ SOLN: 4.0000 mg | Freq: Four times a day (QID) | INTRAMUSCULAR | Status: DC | PRN

## 2024-05-10 MED ORDER — OXYCODONE-ACETAMINOPHEN 5-325 MG PO TABS: 1.0000 | ORAL_TABLET | ORAL | Status: DC | PRN

## 2024-05-10 MED ORDER — DIPHENHYDRAMINE HCL 50 MG/ML IJ SOLN: 12.5000 mg | INTRAMUSCULAR | Status: DC | PRN

## 2024-05-10 NOTE — Discharge Summary (Signed)
 Postpartum Discharge Summary  Date of Service updated***     Patient Name: Mallory Harris DOB: [birth year] MRN: [id number]  Date of admission: 05/10/2024 Delivery date:05/10/2024 Delivering provider: Felipe Horton, [place]  Date of discharge: 05/10/2024  Admitting diagnosis: Labor and delivery indication for care or intervention [O75.9] Intrauterine pregnancy: [redacted]w[redacted]d     Secondary diagnosis:  Principal Problem:   Labor and delivery indication for care or intervention  Additional problems: Anemia, obesity, limited prenatal care    Discharge diagnosis: Term Pregnancy Delivered                                              Post partum procedures:*** Augmentation: N/A Complications: Precipitous delivery   Hospital course: Onset of Labor With Vaginal Delivery      33 y.o. yo G3P2002 at [redacted]w[redacted]d was admitted in Active Labor on 05/10/2024. Labor course was complicated by precipitous delivery.   Membrane Rupture Time/Date:  ,   Delivery Method:Vaginal, Spontaneous Operative Delivery:N/A Episiotomy: None Lacerations:  None Patient had a postpartum course complicated by ***.  She is ambulating, tolerating a regular diet, passing flatus, and urinating well. Patient is discharged home in stable condition on 05/10/24.  Newborn Data: Birth date:05/10/2024 Birth time:9:01 AM Gender:Female Living status:Living Apgars:9 ,9  Weight:   Magnesium  Sulfate received: No BMZ received: No Rhophylac:{Rhophylac received:30440032} BJY:{NWG:95621308} T-DaP:{Tdap:23962} Flu: N/A RSV Vaccine received: No Transfusion:{Transfusion received:30440034}  Immunizations received: Immunization History  Administered Date(s) Administered   Influenza,inj,Quad PF,6+ Mos 09/12/2018   Tdap 06/22/2018, 01/17/2020    Physical exam  Vitals:   05/10/24 0910 05/10/24 0915 05/10/24 0935 05/10/24 0945  BP: 121/84 137/75 122/77 138/80  Pulse: 68 82 82 86  Resp:      Temp:      SpO2:       General: {Exam;  general:21111117} Lochia: {Desc; appropriate/inappropriate:30686::appropriate} Uterine Fundus: {Desc; firm/soft:30687} Incision: {Exam; incision:21111123} DVT Evaluation: {Exam; dvt:2111122} Labs: Lab Results  Component Value Date   WBC 6.3 05/10/2024   HGB 10.8 (L) 05/10/2024   HCT 34.0 (L) 05/10/2024   MCV 86.7 05/10/2024   PLT 145 (L) 05/10/2024      Latest Ref Rng & Units 05/10/2024    8:35 AM  CMP  Glucose 70 - 99 mg/dL 91   BUN 6 - 20 mg/dL <5   Creatinine 6.57 - 1.00 mg/dL 8.46   Sodium 962 - 952 mmol/L 137   Potassium 3.5 - 5.1 mmol/L 3.4   Chloride 98 - 111 mmol/L 108   CO2 22 - 32 mmol/L 18   Calcium 8.9 - 10.3 mg/dL 8.6   Total Protein 6.5 - 8.1 g/dL 6.4   Total Bilirubin 0.0 - 1.2 mg/dL 0.4   Alkaline Phos 38 - 126 U/L 192   AST 15 - 41 U/L 14   ALT 0 - 44 U/L 8    Edinburgh Score:    05/20/2020    1:33 PM  Edinburgh Postnatal Depression Scale Screening Tool  I have been able to laugh and see the funny side of things. 0  I have looked forward with enjoyment to things. 0  I have blamed myself unnecessarily when things went wrong. 1  I have been anxious or worried for no good reason. 0  I have felt scared or panicky for no good reason. 0  Things have been getting on top of me. 1  I  have been so unhappy that I have had difficulty sleeping. 1  I have felt sad or miserable. 0  I have been so unhappy that I have been crying. 0  The thought of harming myself has occurred to me. 0  Edinburgh Postnatal Depression Scale Total 3      Data saved with a previous flowsheet row definition   No data recorded  After visit meds:  Allergies as of 05/10/2024   No Known Allergies   Med Rec must be completed prior to using this [facility]***        Discharge home in stable condition Infant Feeding: Bottle and Breast Infant Disposition:home with mother Discharge instruction: per After Visit Summary and Postpartum booklet. Activity: Advance as tolerated. Pelvic  rest for 6 weeks.  Diet: routine diet Future Appointments:No future appointments. Follow up Visit:  [X]  message sent for scheduling:  Please schedule this patient for a In person postpartum visit in 4 weeks with the following provider: Any provider. Additional Postpartum F/U: N/a  Low risk pregnancy complicated by: Anemia, limited prenatal care, obesity  Delivery mode:  Vaginal, Spontaneous, precipitous delivery  Anticipated Birth Control:  Unsure   05/10/2024 Carey Chapman, MD

## 2024-05-10 NOTE — MAU Note (Signed)
.  Mallory Harris is a 33 y.o. at [redacted]w[redacted]d here in MAU reporting: ctx's that started at 43. Denies vaginal bleeding or Denies LOF. Reports +FM today   Onset of complaint: today 0645 Pain score: 9/10 There were no vitals filed for this visit.    Lab orders placed from triage:   none

## 2024-05-10 NOTE — H&P (Cosign Needed Addendum)
 OBSTETRIC ADMISSION HISTORY AND PHYSICAL  Mallory Harris is a 33 y.o. female G3P2002 with IUP at [redacted]w[redacted]d by LMP presenting for contractions found to be in active labor. She reports +FMs, No LOF, no VB. Contractions started at 0645 this morning.  She plans on breast and bottle feeding. She is undecided for birth control. She received her prenatal [facility]   Dating: By LMP --->  Estimated Date of Delivery: 05/08/24  Sono:   @[redacted]w[redacted]d , CWD, normal anatomy, transverse presentation,  867g, 29% EFW  Prenatal History/Complications:  - Anemia: Hgb 9.8 in May 2025 - Maternal obesity: PG BMI 43.5 - Limited prenatal care: No prenatal care between 18-[redacted] weeks gestation  Past Medical History: Past Medical History:  Diagnosis Date   Medical history non-contributory     Past Surgical History: Past Surgical History:  Procedure Laterality Date   NO PAST SURGERIES      Obstetrical History: OB History     Gravida  3   Para  2   Term  2   Preterm      AB      Living  2      SAB      IAB      Ectopic      Multiple  0   Live Births  2           Social History Social History   Socioeconomic History   Marital status: Single    Spouse name: Not on file   Number of children: Not on file   Years of education: Not on file   Highest education level: Not on file  Occupational History   Not on file  Tobacco Use   Smoking status: Never   Smokeless tobacco: Never  Vaping Use   Vaping status: Never Used  Substance and Sexual Activity   Alcohol use: No   Drug use: No   Sexual activity: Yes    Birth control/protection: I.U.D.  Other Topics Concern   Not on file  Social History Narrative   Not on file   Social Drivers of Health   Financial Resource Strain: Not on file  Food Insecurity: Not on file  Transportation Needs: Not on file  Physical Activity: Not on file  Stress: Not on file  Social Connections: Not on file    Family History: Family History  Problem  Relation Age of Onset   Diabetes Maternal Grandmother     Allergies: No Known Allergies  Medications Prior to Admission  Medication Sig Dispense Refill Last Dose/Taking   Ferric Maltol  30 MG CAPS Take 1 capsule (30 mg total) by mouth 2 (two) times daily. Please take one hour before breakfast and dinner 60 capsule 2    Prenatal Vit-Fe Fumarate-FA (MULTIVITAMIN-PRENATAL) 27-0.8 MG TABS tablet Take 1 tablet by mouth daily at 12 noon.      promethazine  (PHENERGAN ) 25 MG tablet Take 1 tablet (25 mg total) by mouth every 6 (six) hours as needed for nausea or vomiting. (Patient not taking: Reported on 04/15/2024) 30 tablet 0      Review of Systems   Blood pressure 138/80, pulse 86, temperature 98.2 F (36.8 C), resp. rate 16, last menstrual period 08/02/2023, SpO2 99%. General appearance: alert and cooperative Lungs: no respiratory distress Heart: regular rate, warm and well-perfused  Abdomen: gravid Extremities: BLE without significant swelling or erythema.  Presentation: cephalic Fetal monitoringBaseline: 120 bpm, Variability: Good {> 6 bpm), Accelerations: Reactive, and Decelerations: Absent Uterine activity: Regular contractions  Dilation: 10 Dilation Complete Date: 05/10/24 Dilation Complete Time: 1884 Effacement (%): 100 Station: Plus 2 Presentation: Vertex Exam by:: Chelsea RN   Prenatal labs: ABO, Rh: --/--/A POS (06/13 1660) Antibody: PENDING (06/13 6301) Rubella: 1.71 (05/19 1536) RPR: Non Reactive (05/19 1536)  HBsAg: Negative (05/19 1536)  HIV: Non Reactive (05/19 1536)  GBS: Negative/-- (05/19 1529)  1 hr Glucola: Not done Genetic screening:  Not done Anatomy US : Female, normal   Prenatal Transfer Tool  Maternal Diabetes: No Genetic Screening: Declined Maternal Ultrasounds/Referrals: Normal, limited Fetal Ultrasounds or other Referrals:  None Maternal Substance Abuse:  No Significant Maternal Medications:  ASA  Significant Maternal Lab Results: Group B Strep  negative  Results for orders placed or performed during the hospital encounter of 05/10/24 (from the past 24 hours)  Type and screen [facility]   Collection Time: 05/10/24  8:32 AM  Result Value Ref Range   ABO/RH(D) A POS    Antibody Screen PENDING    Sample Expiration      05/13/2024,2359 Performed at [facility], [address], [place], [place] [address]   CBC   Collection Time: 05/10/24  8:35 AM  Result Value Ref Range   WBC 6.3 4.0 - 10.5 K/uL   RBC 3.92 3.87 - 5.11 MIL/uL   Hemoglobin 10.8 (L) 12.0 - 15.0 g/dL   HCT 32.3 (L) 55.7 - 32.2 %   MCV 86.7 80.0 - 100.0 fL   MCH 27.6 26.0 - 34.0 pg   MCHC 31.8 30.0 - 36.0 g/dL   RDW 02.5 42.7 - 06.2 %   Platelets 145 (L) 150 - 400 K/uL   nRBC 0.0 0.0 - 0.2 %  Comprehensive metabolic panel   Collection Time: 05/10/24  8:35 AM  Result Value Ref Range   Sodium 137 135 - 145 mmol/L   Potassium 3.4 (L) 3.5 - 5.1 mmol/L   Chloride 108 98 - 111 mmol/L   CO2 18 (L) 22 - 32 mmol/L   Glucose, Bld 91 70 - 99 mg/dL   BUN <5 (L) 6 - 20 mg/dL   Creatinine, Ser 3.76 0.44 - 1.00 mg/dL   Calcium 8.6 (L) 8.9 - 10.3 mg/dL   Total Protein 6.4 (L) 6.5 - 8.1 g/dL   Albumin 2.6 (L) 3.5 - 5.0 g/dL   AST 14 (L) 15 - 41 U/L   ALT 8 0 - 44 U/L   Alkaline Phosphatase 192 (H) 38 - 126 U/L   Total Bilirubin 0.4 0.0 - 1.2 mg/dL   GFR, Estimated >28 >31 mL/min   Anion gap 11 5 - 15    Patient Active Problem List   Diagnosis Date Noted   Labor and delivery indication for care or intervention 05/10/2024   Insufficient prenatal care 04/15/2024   Maternal morbid obesity, antepartum (HCC) 12/20/2023   Supervision of high-risk pregnancy 11/20/2023   Anemia, antepartum 04/19/2018    Assessment/Plan:  Mallory Harris is a 33 y.o. G3P2002 at [redacted]w[redacted]d here for active labor.   #Labor: Active labor  #Pain: Per patient request  #FWB: Cat 1 #ID:  GBS negative  #MOF: Breast and bottle  #MOC: Undecided  #Circ:  Desired  Carey Chapman, MD  05/10/2024, 9:48 AM  I was present for the exam and agree with above.  Felipe Horton, Maimouna Rondeau , CNM 05/10/2024 6:18 PM

## 2024-05-11 MED ORDER — IBUPROFEN 600 MG PO TABS
600.0000 mg | ORAL_TABLET | Freq: Four times a day (QID) | ORAL | 0 refills | Status: AC | PRN
Start: 2024-05-11 — End: ?

## 2024-05-13 LAB — T.PALLIDUM AB, TOTAL: T Pallidum Abs: NONREACTIVE

## 2024-05-20 ENCOUNTER — Telehealth (HOSPITAL_COMMUNITY): Payer: Self-pay | Admitting: *Deleted

## 2024-05-20 NOTE — Telephone Encounter (Signed)
 05/20/2024  Name: Mallory Harris MRN: [id number] DOB: 12-15-1990  Reason for Call:  Transition of Care Hospital Discharge Call  Contact Status: Patient Contact Status: Message  Language assistant needed:          Follow-Up Questions:    [place] Postnatal Depression Scale:  In the Past 7 Days:    PHQ2-9 Depression Scale:     Discharge Follow-up:    Post-discharge interventions: NA  Mliss Sieve, RN 05/20/2024 14:27
